# Patient Record
Sex: Female | Born: 1970 | Race: Black or African American | Hispanic: No | Smoking: Never smoker
Health system: Southern US, Community
[De-identification: ages and names within clinical notes are randomized; demographics above are authoritative.]

## PROBLEM LIST (undated history)

## (undated) DIAGNOSIS — J45909 Unspecified asthma, uncomplicated: Secondary | ICD-10-CM

## (undated) DIAGNOSIS — U071 COVID-19: Secondary | ICD-10-CM

## (undated) DIAGNOSIS — I10 Essential (primary) hypertension: Secondary | ICD-10-CM

## (undated) HISTORY — PX: VAGINAL HYSTERECTOMY: SUR661

## (undated) HISTORY — DX: COVID-19: U07.1

## (undated) HISTORY — PX: ABDOMINAL HYSTERECTOMY: SHX81

## (undated) HISTORY — PX: CHOLECYSTECTOMY: SHX55

## (undated) HISTORY — DX: Unspecified asthma, uncomplicated: J45.909

---

## 2003-04-23 ENCOUNTER — Other Ambulatory Visit: Payer: Self-pay

## 2004-07-02 ENCOUNTER — Ambulatory Visit: Payer: Self-pay

## 2004-10-13 ENCOUNTER — Ambulatory Visit: Payer: Self-pay | Admitting: *Deleted

## 2006-06-12 ENCOUNTER — Emergency Department: Payer: Self-pay | Admitting: Emergency Medicine

## 2007-03-12 ENCOUNTER — Emergency Department: Payer: Self-pay | Admitting: Internal Medicine

## 2008-03-16 ENCOUNTER — Emergency Department: Payer: Self-pay | Admitting: Emergency Medicine

## 2008-03-18 ENCOUNTER — Inpatient Hospital Stay: Payer: Self-pay | Admitting: Surgery

## 2011-09-15 ENCOUNTER — Emergency Department: Payer: Self-pay | Admitting: *Deleted

## 2012-04-25 ENCOUNTER — Emergency Department: Payer: Self-pay | Admitting: Emergency Medicine

## 2016-06-12 ENCOUNTER — Ambulatory Visit
Admission: EM | Admit: 2016-06-12 | Discharge: 2016-06-12 | Disposition: A | Discharge status: Home / Self Care | Payer: BLUE CROSS/BLUE SHIELD | Attending: Family | Admitting: Family

## 2016-06-12 DIAGNOSIS — L02411 Cutaneous abscess of right axilla: Secondary | ICD-10-CM | POA: Diagnosis not present

## 2016-06-12 DIAGNOSIS — M25462 Effusion, left knee: Secondary | ICD-10-CM

## 2016-06-12 DIAGNOSIS — M25562 Pain in left knee: Secondary | ICD-10-CM

## 2016-06-12 MED ORDER — SULFAMETHOXAZOLE-TRIMETHOPRIM 800-160 MG PO TABS: 1.0000 | ORAL_TABLET | Freq: Two times a day (BID) | ORAL | 0 refills | Status: AC

## 2016-06-12 MED ORDER — NAPROXEN 500 MG PO TABS: 500.0000 mg | ORAL_TABLET | Freq: Two times a day (BID) | ORAL | 0 refills | Status: DC | PRN

## 2016-06-12 NOTE — ED Provider Notes (Signed)
CSN: [id number]     Arrival date & time 06/12/16  1326 History   First MD Initiated Contact with Patient 06/12/16 1418     Chief Complaint  Patient presents with  . Abscess    right armpit  . Foot Swelling    bilateral   (Consider location/radiation/quality/duration/timing/severity/associated sxs/prior Treatment) 46 year old female presents with a possible cyst or abscess under her right arm along her bra-line. She had an abscess in that area about 4-5 months ago that was drained at [facility]. They extracted pus and fluid out of abscess and placed her on antibiotics. She is now experiencing another ?cyst for the past week in the same area. She has been applying warm compresses to area with no relief. She is also concerned over left knee swelling and pain that has been occurring also for the past week. 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 She has worked 2 weeks straight without a break and uncertain if swelling is due to prolonged standing. She took an Aleve last night which has helped the pain and swelling but causes drowsiness. She had also noticed increased swelling in both feet but they have improved with elevating her feet last night. She has had surgery on her left knee to remove a ?cyst in 2010. Otherwise no other chronic health issues. Takes no daily medication.    The history is provided by the patient.    History reviewed. No pertinent past medical history. Past Surgical History:  Procedure Laterality Date  . ABDOMINAL HYSTERECTOMY    . CESAREAN SECTION    . CHOLECYSTECTOMY     History reviewed. No pertinent family history. Social History  Substance Use Topics  . Smoking status: Never Smoker  . Smokeless tobacco: Never Used  . Alcohol use No   OB History    No data available     Review of Systems  Constitutional: Positive for fatigue. Negative for appetite change, chills and fever.  Respiratory: Negative for cough, chest tightness and shortness of breath.     Cardiovascular: Negative for chest pain and leg swelling.  Gastrointestinal: Negative for diarrhea, nausea and vomiting.  Endocrine: Negative for polydipsia, polyphagia and polyuria.  Genitourinary: Positive for decreased urine volume. Negative for difficulty urinating, dysuria, flank pain, frequency and urgency.  Musculoskeletal: Positive for arthralgias, joint swelling and myalgias. Negative for back pain and neck pain.  Skin: Positive for wound. Negative for color change and rash.  Allergic/Immunologic: Negative for immunocompromised state.  Neurological: Negative for dizziness, tremors, weakness, light-headedness, numbness and headaches.  Hematological: Negative for adenopathy. Does not bruise/bleed easily.    Allergies  Shellfish allergy; Codeine; and Latex  Home Medications   Prior to Admission medications   Medication Sig Start Date End Date Taking? Authorizing Provider  naproxen (NAPROSYN) 500 MG tablet Take 1 tablet (500 mg total) by mouth 2 (two) times daily as needed for moderate pain. 06/12/16   Sudie Grumbling, NP  sulfamethoxazole-trimethoprim (BACTRIM DS,SEPTRA DS) 800-160 MG tablet Take 1 tablet by mouth 2 (two) times daily. 06/12/16 06/19/16  Sudie Grumbling, NP   Meds Ordered and Administered this Visit  Medications - No data to display  BP 131/75 (BP Location: Left Arm)   Pulse 73   Temp 98 F (36.7 C) (Oral)   Resp 18   Ht  (1.702 m)   Wt 252 lb (114.3 kg)   SpO2 99%   BMI 39.47 kg/m  No data found.  Physical Exam  Constitutional: She is oriented to person, place, and time. She appears well-developed and well-nourished. No distress.  HENT:  Head: Normocephalic and atraumatic.  Mouth/Throat: Oropharynx is clear and moist.  Neck: Normal range of motion. Neck supple.  Cardiovascular: Normal rate, regular rhythm and normal heart sounds.   Pulmonary/Chest: Effort normal and breath sounds normal. No respiratory distress. She exhibits tenderness and  swelling.    5mm wide by 3mm tall oval cyst present along right mid-axillary line. Flesh-colored raised cyst which feels soft but tender. Minimal erythema surrounding area. No discharge.   Musculoskeletal: She exhibits edema and tenderness.       Left knee: She exhibits decreased range of motion, swelling and effusion. She exhibits no ecchymosis, no deformity, no erythema, normal alignment, no LCL laxity, normal patellar mobility, no bony tenderness and no MCL laxity. Tenderness found. Medial joint line tenderness noted.  Left knee has slight decreased range of motion, particularly with extension. Slightly tender over the patella. Effusion present more medial than lateral near patella. 3cm scar present mid-knee. No numbness or distinct swelling distal to area. Pulses are normal and equal bilaterally in her feet and no neuro deficits noted.   Lymphadenopathy:    She has no cervical adenopathy.  Neurological: She is alert and oriented to person, place, and time. She has normal strength. No sensory deficit.  Skin: Skin is warm and dry. Capillary refill takes less than 2 seconds. No rash noted.  Psychiatric: She has a normal mood and affect. Her behavior is normal. Judgment and thought content normal.    Urgent Care Course     .Marland KitchenIncision and Drainage Date/Time: 06/12/2016 2:40 PM Performed by: Carita Pian, Annais Crafts BERRY Authorized by: Sudie Grumbling   Consent:    Consent obtained:  Verbal   Consent given by:  Patient   Risks discussed:  Incomplete drainage and pain   Alternatives discussed:  No treatment, delayed treatment and observation Location:    Type:  Cyst   Size:  5mm by 3mm   Location:  Trunk   Trunk location:  Chest Pre-procedure details:    Skin preparation:  Antiseptic wash Anesthesia (see MAR for exact dosages):    Anesthesia method:  None Procedure type:    Complexity:  Simple Procedure details:    Needle aspiration: no     Incision types:  Single straight   Incision depth:   Dermal   Scalpel blade:  11   Wound management:  Probed and deloculated   Drainage:  Bloody and serous   Drainage amount:  Scant   Wound treatment:  Wound left open   Packing materials:  None Post-procedure details:    Patient tolerance of procedure:  Tolerated well, no immediate complications Comments:     Mostly bloody drainage present. No pus or discolored drainage. More solid contents of cyst.     (including critical care time)  Labs Review Labs Reviewed - No data to display  Imaging Review No results found.   Visual Acuity Review  Right Eye Distance:   Left Eye Distance:   Bilateral Distance:    Right Eye Near:   Left Eye Near:    Bilateral Near:         MDM   1. Abscess of axilla, right   2. Pain and swelling of left knee    Discussed that lesion under right mid-axillary line appears to be a type of cyst- similar to an enlarged skin tag- no purulent drainage obtained. Will place  patient on antibiotics in case of infection beneath cyst- start Bactrim twice a day as directed. Referred patient to dermatologist for further evaluation- patient does have insurance and has seen a local dermatologist here before.  Discussed possible drainage of fluid on left knee- reviewed with patient that I was not comfortable performing joint aspiration on area where previous surgery was performed. Since anti-inflammatories were helping pain and swelling, recommend start Naproxen  twice a day as directed.Ace wrap applied to left knee. Keep elevated as much as possible. Note written to be out of work for 3 days.  Recommend patient see her Orthopedic for further evaluation and possible joint aspiration. Recommend follow-up with the Dermatologist and Orthopedic as planned.      Sudie Grumbling, NP 06/13/16 [phone]

## 2016-06-12 NOTE — ED Triage Notes (Signed)
Pt had a abscess drained at [facility] about 4-5 months ago and she is starting to experience the same pain under her right armpit again. She mentions right knee swelling ( had knee surgery in 2010) and bilateral feet swelling the last 4-5 days.

## 2016-06-12 NOTE — Discharge Instructions (Signed)
Recommend start Bactrim twice a day as directed. May clean cyst area with soap and water. Recommend follow-up with a dermatologist for further evaluation of the cyst. Also start Naproxen  twice a day as needed for knee pain and swelling. Use ace wrap or other compression sleeve to help with support. May need to see the Orthopedic for further evaluation and possible drainage of fluid on knee.

## 2016-07-20 ENCOUNTER — Other Ambulatory Visit: Payer: Self-pay | Admitting: Nurse Practitioner

## 2016-07-20 DIAGNOSIS — R928 Other abnormal and inconclusive findings on diagnostic imaging of breast: Secondary | ICD-10-CM

## 2016-09-09 ENCOUNTER — Other Ambulatory Visit: Payer: BLUE CROSS/BLUE SHIELD

## 2016-09-13 ENCOUNTER — Ambulatory Visit
Admission: RE | Admit: 2016-09-13 | Discharge: 2016-09-13 | Disposition: A | Discharge status: Home / Self Care | Payer: BLUE CROSS/BLUE SHIELD | Source: Ambulatory Visit | Attending: Nurse Practitioner | Admitting: Nurse Practitioner

## 2016-09-13 DIAGNOSIS — R928 Other abnormal and inconclusive findings on diagnostic imaging of breast: Secondary | ICD-10-CM | POA: Insufficient documentation

## 2016-11-27 ENCOUNTER — Ambulatory Visit (INDEPENDENT_AMBULATORY_CARE_PROVIDER_SITE_OTHER): Payer: BLUE CROSS/BLUE SHIELD

## 2016-11-27 ENCOUNTER — Ambulatory Visit
Admission: EM | Admit: 2016-11-27 | Discharge: 2016-11-27 | Disposition: A | Discharge status: Home / Self Care | Payer: BLUE CROSS/BLUE SHIELD | Attending: Emergency Medicine | Admitting: Emergency Medicine

## 2016-11-27 ENCOUNTER — Encounter: Payer: Self-pay | Admitting: Gynecology

## 2016-11-27 DIAGNOSIS — M25531 Pain in right wrist: Secondary | ICD-10-CM | POA: Diagnosis not present

## 2016-11-27 DIAGNOSIS — M654 Radial styloid tenosynovitis [de Quervain]: Secondary | ICD-10-CM

## 2016-11-27 MED ORDER — NAPROXEN 500 MG PO TABS: 500.0000 mg | ORAL_TABLET | Freq: Two times a day (BID) | ORAL | 0 refills | Status: DC

## 2016-11-27 NOTE — ED Provider Notes (Signed)
[facility]    CSN: [id number] Arrival date & time: 11/27/16  1540     History   Chief Complaint Chief Complaint  Patient presents with  . Wrist Pain    HPI Melanie Lester is a 46 y.o. female.   HPI  46 year old female who presents with right radial wrist pain that she's had for 2 days. She states that she is actually had the pain for about 2 weeks but is worsened immensely over the last 2 days. She denies any injury to her wrist. 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 She has mild corresponding pain over the ulnar aspect of the distal ulna but not as bad as on the right.       History reviewed. No pertinent past medical history.  There are no active problems to display for this patient.   Past Surgical History:  Procedure Laterality Date  . ABDOMINAL HYSTERECTOMY    . CESAREAN SECTION    . CHOLECYSTECTOMY      OB History    No data available       Home Medications    Prior to Admission medications   Medication Sig Start Date End Date Taking? Authorizing Provider  naproxen (NAPROSYN) 500 MG tablet Take 1 tablet (500 mg total) by mouth 2 (two) times daily with a meal. 11/27/16   Lutricia Feil, PA-C    Family History No family history on file.  Social History Social History  Substance Use Topics  . Smoking status: Never Smoker  . Smokeless tobacco: Never Used  . Alcohol use No     Allergies   Shellfish allergy; Codeine; and Latex   Review of Systems Review of Systems  Constitutional: Positive for activity change. Negative for appetite change, chills, fatigue and fever.  Musculoskeletal: Positive for myalgias.  All other systems reviewed and are negative.    Physical Exam Triage Vital Signs ED Triage Vitals  Enc Vitals Group     BP 11/27/16 1552 133/72     Pulse Rate 11/27/16 1552 61     Resp 11/27/16 1552 16     Temp 11/27/16 1552 98.4 F (36.9 C)     Temp Source 11/27/16 1552 Oral     SpO2 11/27/16 1552 100 %     Weight 11/27/16 1550 225 lb (102.1 kg)     Height 11/27/16 1550  (1.702 m)     Head Circumference --      Peak Flow --      Pain Score 11/27/16 1551 8     Pain Loc --      Pain Edu? --      Excl. in GC? --    No data found.   Updated Vital Signs BP 133/72 (BP Location: Left Arm)   Pulse 61   Temp 98.4 F (36.9 C) (Oral)   Resp 16   Ht  (1.702 m)   Wt 225 lb (102.1 kg)   SpO2 100%   BMI 35.24 kg/m   Visual Acuity Right Eye Distance:   Left Eye Distance:   Bilateral Distance:    Right Eye Near:   Left Eye Near:    Bilateral Near:     Physical Exam  Constitutional: She is oriented to person, place, and time. She appears well-developed and well-nourished. No distress.  HENT:  Head: Normocephalic.  Eyes: Pupils are equal, round, and reactive to light.  Neck: Normal range of motion.  Musculoskeletal:  She exhibits tenderness.  Examination of the right wrist shows mild decreased range of motion to dorsiflexion plantar flexion and radial deviation. Tenderness is along the tendons of the first dorsal compartment is maximal at the distal tip of the radius. Is a positive Finkelstein's test. Lifting against resistance in supination also reproduces her symptoms.  Neurological: She is alert and oriented to person, place, and time.  Skin: Skin is warm and dry. She is not diaphoretic.  Psychiatric: She has a normal mood and affect. Her behavior is normal. Judgment and thought content normal.  Nursing note and vitals reviewed.    UC Treatments / Results  Labs (all labs ordered are listed, but only abnormal results are displayed) Labs Reviewed - No data to display  EKG  EKG Interpretation None       Radiology Dg Wrist Complete Right  Result Date: 11/27/2016 CLINICAL DATA:  RIGHT wrist pain for 1 month EXAM: RIGHT WRIST - COMPLETE 3+ VIEW COMPARISON:  None. FINDINGS: No distal radius or ulnar fracture. Radiocarpal joint is intact.  No carpal fracture. No soft tissue abnormality. IMPRESSION: No fracture or dislocation. Electronically Signed   By: Genevive Bi M.D.   On: 11/27/2016 16:41    Procedures Procedures (including critical care time)  Medications Ordered in UC Medications - No data to display   Initial Impression / Assessment and Plan / UC Course  I have reviewed the triage vital signs and the nursing notes.  Pertinent labs & imaging results that were available during my care of the patient were reviewed by me and considered in my medical decision making (see chart for details).     Plan: 1. Test/x-ray results and diagnosis reviewed with patient 2. rx as per orders; risks, benefits, potential side effects reviewed with patient 3. Recommend supportive treatment with use of the splint full-time for 1 week and for activities on the second week.. Use Naprosyn one tablet twice daily with food. If not improving in 2 weeks follow-up with orthopedic surgery at [facility]. 4. F/u prn if symptoms worsen or don't improve   Final Clinical Impressions(s) / UC Diagnoses   Final diagnoses:  Tenosynovitis, de Quervain    New Prescriptions New Prescriptions   NAPROXEN (NAPROSYN) 500 MG TABLET    Take 1 tablet (500 mg total) by mouth 2 (two) times daily with a meal.     Controlled Substance Prescriptions [place] Controlled Substance Registry consulted? Not Applicable   Lutricia Feil, PA-C 11/27/16 1657

## 2016-11-27 NOTE — ED Triage Notes (Signed)
Paint c/o right wrist pain x 2 days. Patient deny any injury to her wrist. Per patient an hairdresser.

## 2017-04-11 ENCOUNTER — Emergency Department
Admission: EM | Admit: 2017-04-11 | Discharge: 2017-04-11 | Disposition: A | Discharge status: Home / Self Care | Payer: BLUE CROSS/BLUE SHIELD | Attending: Emergency Medicine | Admitting: Emergency Medicine

## 2017-04-11 ENCOUNTER — Emergency Department: Payer: BLUE CROSS/BLUE SHIELD

## 2017-04-11 ENCOUNTER — Other Ambulatory Visit: Payer: Self-pay

## 2017-04-11 ENCOUNTER — Encounter: Payer: Self-pay | Admitting: Emergency Medicine

## 2017-04-11 DIAGNOSIS — Z79899 Other long term (current) drug therapy: Secondary | ICD-10-CM | POA: Insufficient documentation

## 2017-04-11 DIAGNOSIS — L03211 Cellulitis of face: Secondary | ICD-10-CM

## 2017-04-11 DIAGNOSIS — T783XXA Angioneurotic edema, initial encounter: Secondary | ICD-10-CM | POA: Diagnosis not present

## 2017-04-11 DIAGNOSIS — K047 Periapical abscess without sinus: Secondary | ICD-10-CM | POA: Diagnosis not present

## 2017-04-11 DIAGNOSIS — R600 Localized edema: Secondary | ICD-10-CM | POA: Diagnosis present

## 2017-04-11 LAB — CBC WITH DIFFERENTIAL/PLATELET
BASOS PCT: 1 %
Basophils Absolute: 0.1 10*3/uL (ref 0–0.1)
EOS ABS: 0.4 10*3/uL (ref 0–0.7)
Eosinophils Relative: 4 %
HCT: 36.5 % (ref 35.0–47.0)
HEMOGLOBIN: 12.1 g/dL (ref 12.0–16.0)
LYMPHS ABS: 3 10*3/uL (ref 1.0–3.6)
Lymphocytes Relative: 24 %
MCH: 29.1 pg (ref 26.0–34.0)
MCHC: 33.2 g/dL (ref 32.0–36.0)
MCV: 87.6 fL (ref 80.0–100.0)
MONO ABS: 1 10*3/uL — AB (ref 0.2–0.9)
MONOS PCT: 8 %
NEUTROS PCT: 63 %
Neutro Abs: 7.9 10*3/uL — ABNORMAL HIGH (ref 1.4–6.5)
Platelets: 364 10*3/uL (ref 150–440)
RBC: 4.16 MIL/uL (ref 3.80–5.20)
RDW: 13.7 % (ref 11.5–14.5)
WBC: 12.4 10*3/uL — ABNORMAL HIGH (ref 3.6–11.0)

## 2017-04-11 LAB — BASIC METABOLIC PANEL
Anion gap: 8 (ref 5–15)
BUN: 14 mg/dL (ref 6–20)
CALCIUM: 8.8 mg/dL — AB (ref 8.9–10.3)
CHLORIDE: 104 mmol/L (ref 101–111)
CO2: 24 mmol/L (ref 22–32)
CREATININE: 0.87 mg/dL (ref 0.44–1.00)
GFR calc Af Amer: >60 mL/min (ref >60)
GFR calc non Af Amer: >60 mL/min (ref >60)
Glucose, Bld: 109 mg/dL — ABNORMAL HIGH (ref 65–99)
Potassium: 4 mmol/L (ref 3.5–5.1)
SODIUM: 136 mmol/L (ref 135–145)

## 2017-04-11 MED ORDER — SODIUM CHLORIDE 0.9 % IV SOLN
3.0000 g | INTRAVENOUS | Status: AC
  Administered 2017-04-11: 3 g via INTRAVENOUS
  Filled 2017-04-11: qty 3

## 2017-04-11 MED ORDER — PREDNISONE 10 MG PO TABS: ORAL_TABLET | ORAL | 0 refills | Status: DC

## 2017-04-11 MED ORDER — AMOXICILLIN-POT CLAVULANATE 875-125 MG PO TABS: 1.0000 | ORAL_TABLET | Freq: Two times a day (BID) | ORAL | 0 refills | Status: AC

## 2017-04-11 MED ORDER — METHYLPREDNISOLONE SODIUM SUCC 125 MG IJ SOLR
INTRAMUSCULAR | Status: AC
  Filled 2017-04-11: qty 2

## 2017-04-11 MED ORDER — IOPAMIDOL (ISOVUE-300) INJECTION 61%
75.0000 mL | Freq: Once | INTRAVENOUS | Status: AC | PRN
  Administered 2017-04-11: 75 mL via INTRAVENOUS

## 2017-04-11 MED ORDER — EPINEPHRINE 0.3 MG/0.3ML IJ SOAJ: 0.3000 mg | Freq: Once | INTRAMUSCULAR | 0 refills | Status: AC

## 2017-04-11 MED ORDER — DIPHENHYDRAMINE HCL 50 MG/ML IJ SOLN
INTRAMUSCULAR | Status: AC
  Filled 2017-04-11: qty 1

## 2017-04-11 MED ORDER — FAMOTIDINE IN NACL 20-0.9 MG/50ML-% IV SOLN
INTRAVENOUS | Status: AC
  Filled 2017-04-11: qty 50

## 2017-04-11 MED ORDER — DEXAMETHASONE SODIUM PHOSPHATE 10 MG/ML IJ SOLN
10.0000 mg | Freq: Once | INTRAMUSCULAR | Status: AC
  Administered 2017-04-11: 10 mg via INTRAVENOUS
  Filled 2017-04-11: qty 1

## 2017-04-11 MED ORDER — FAMOTIDINE IN NACL 20-0.9 MG/50ML-% IV SOLN
20.0000 mg | Freq: Once | INTRAVENOUS | Status: AC
  Administered 2017-04-11: 20 mg via INTRAVENOUS

## 2017-04-11 MED ORDER — DIPHENHYDRAMINE HCL 50 MG/ML IJ SOLN
50.0000 mg | INTRAMUSCULAR | Status: AC
  Administered 2017-04-11: 50 mg via INTRAVENOUS

## 2017-04-11 MED ORDER — RACEPINEPHRINE HCL 2.25 % IN NEBU
INHALATION_SOLUTION | RESPIRATORY_TRACT | Status: AC
  Filled 2017-04-11: qty 0.5

## 2017-04-11 MED ORDER — METHYLPREDNISOLONE SODIUM SUCC 125 MG IJ SOLR
125.0000 mg | Freq: Once | INTRAMUSCULAR | Status: AC
  Administered 2017-04-11: 125 mg via INTRAVENOUS

## 2017-04-11 MED ORDER — RACEPINEPHRINE HCL 2.25 % IN NEBU
0.5000 mL | INHALATION_SOLUTION | Freq: Once | RESPIRATORY_TRACT | Status: AC
  Administered 2017-04-11: 0.5 mL via RESPIRATORY_TRACT

## 2017-04-11 NOTE — ED Provider Notes (Signed)
[facility] Emergency Department Provider Note  ____________________________________________   First MD Initiated Contact with Patient 04/11/17 0408     (approximate)  I have reviewed the triage vital signs and the nursing notes.   HISTORY  Chief Complaint Allergic Reaction    HPI Melanie Lester is a 47 y.o. female with no contributory past medical history but who does report a number of allergies including an allergy to clindamycin who presents for evaluation of acute onset severe facial swelling.  She reports that he developed some pain in a right upper tooth the day before yesterday.  She followed up with the dentist yesterday and she was started on clindamycin.  She took the first dose in the afternoon yesterday but did not remember that she had an allergy to it.  She was starting to feel little bit swollen before she went to bed and then she woke up just prior to arrival with severe swelling throughout her face.  She reports that when she tries to lie flat she feels some difficulty breathing but only when she is lying flat.  Her voice is a little bit muffled and different from normal.  She is not in any respiratory distress.  When she woke up with the swelling she called her mom and her mom confirmed that she did have an allergic reaction to clindamycin in the past although the patient does not know what that reaction was.  She denies fever/chills, visual changes, difficulty swallowing, difficulty breathing except when lying flat, nausea, vomiting, abdominal pain, and diarrhea.  Nothing in particular makes her symptoms better nor worse and she has not taken anything for the symptoms.  History reviewed. No pertinent past medical history.  There are no active problems to display for this patient.   Past Surgical History:  Procedure Laterality Date  . ABDOMINAL HYSTERECTOMY    . CESAREAN SECTION    . CHOLECYSTECTOMY      Prior to Admission medications     Medication Sig Start Date End Date Taking? Authorizing Provider  amoxicillin-clavulanate (AUGMENTIN) 875-125 MG tablet Take 1 tablet by mouth every 12 (twelve) hours for 10 days. 04/11/17 04/21/17  Loleta Rose, MD  EPINEPHrine (EPIPEN 2-PAK) 0.3 mg/0.3 mL IJ SOAJ injection Inject 0.3 mLs (0.3 mg total) into the muscle once for 1 dose. Take for severe allergic reaction, then come immediately to the Emergency Department or call 911. 04/11/17 04/11/17  Loleta Rose, MD  naproxen (NAPROSYN) 500 MG tablet Take 1 tablet (500 mg total) by mouth 2 (two) times daily with a meal. 11/27/16   Lutricia Feil, PA-C  predniSONE (DELTASONE) 10 MG tablet Take 6 tabs (60 mg) PO x 3 days, then take 4 tabs (40 mg) PO x 3 days, then take 2 tabs (20 mg) PO x 3 days, then take 1 tab (10 mg) PO x 3 days, then take 1/2 tab (5 mg) PO x 4 days. 04/11/17   Loleta Rose, MD    Allergies Shellfish allergy; Clindamycin/lincomycin; Codeine; and Latex  No family history on file.  Social History Social History   Tobacco Use  . Smoking status: Never Smoker  . Smokeless tobacco: Never Used  Substance Use Topics  . Alcohol use: No  . Drug use: No    Review of Systems Constitutional: No fever/chills Eyes: No visual changes. ENT: Acute onset severe swelling to the face.  No sore throat nor difficulty swallowing. Cardiovascular: Denies chest pain. Respiratory: Mild shortness of breath when lying flat.  Gastrointestinal: No abdominal pain.  No nausea, no vomiting.  No diarrhea.  No constipation. Genitourinary: Negative for dysuria. Musculoskeletal: Negative for neck pain.  Negative for back pain. Integumentary: Acute onset severe swelling to the face Neurological: Negative for headaches, focal weakness or numbness.   ____________________________________________   PHYSICAL EXAM:  VITAL SIGNS: ED Triage Vitals [04/11/17 0406]  Enc Vitals Group     BP 138/77     Pulse Rate 74     Resp 20     Temp 99.3 F (37.4 C)      Temp Source Axillary     SpO2 99 %     Weight      Height      Head Circumference      Peak Flow      Pain Score      Pain Loc      Pain Edu?      Excl. in GC?     Constitutional: Alert and oriented.  No acute distress but with obvious angioedema. Eyes: Conjunctivae are normal.  Head: Atraumatic. Nose: No congestion/rhinnorhea. Mouth/Throat: Mucous membranes are moist.  No obvious tongue swelling or swelling of the posterior oropharynx, but visualization is relatively poor with a Mallampati score of III-IV.  No difficulty swallowing secretions.  Slightly hoarse voice Neck: No stridor.  No meningeal signs.   Cardiovascular: Normal rate, regular rhythm. Good peripheral circulation. Grossly normal heart sounds. Respiratory: Normal respiratory effort.  No retractions. Lungs CTAB. Gastrointestinal: Soft and nontender. No distention.  Musculoskeletal: No lower extremity tenderness nor edema. No gross deformities of extremities. Neurologic:  Normal speech and language. No gross focal neurologic deficits are appreciated.  Skin:  Skin is warm, dry and intact. No rash noted. Psychiatric: Mood and affect are normal. Speech and behavior are normal.  ____________________________________________   LABS (all labs ordered are listed, but only abnormal results are displayed)  Labs Reviewed  CBC WITH DIFFERENTIAL/PLATELET - Abnormal; Notable for the following components:      Result Value   WBC 12.4 (*)    Neutro Abs 7.9 (*)    Monocytes Absolute 1.0 (*)    All other components within normal limits  BASIC METABOLIC PANEL - Abnormal; Notable for the following components:   Glucose, Bld 109 (*)    Calcium 8.8 (*)    All other components within normal limits   ____________________________________________  EKG  None - EKG not ordered by ED physician ____________________________________________  RADIOLOGY   ED MD interpretation:  Facial cellulitis, dental abscess, and  angioedema  Official radiology report(s): Ct Soft Tissue Neck W Contrast  Result Date: 04/11/2017 CLINICAL DATA:  Seen at dentist yesterday for dental abscess. Took antibiotic with subsequent allergic reaction. Assess for abscess, cellulitis, angioedema. Evaluate airway. EXAM: CT NECK WITH CONTRAST TECHNIQUE: Multidetector CT imaging of the neck was performed using the standard protocol following the bolus administration of intravenous contrast. CONTRAST:  75mL ISOVUE-300 IOPAMIDOL (ISOVUE-300) INJECTION 61% COMPARISON:  None. FINDINGS: PHARYNX AND LARYNX: Normal.  Widely patent airway. SALIVARY GLANDS: Normal. THYROID: Mild thyromegaly without dominant nodule. LYMPH NODES: No lymphadenopathy by CT size criteria. VASCULAR: Normal. LIMITED INTRACRANIAL: Normal. VISUALIZED ORBITS: Normal. MASTOIDS AND VISUALIZED PARANASAL SINUSES: Subcentimeter RIGHT posterior ethmoid osteoma. Mild RIGHT maxillary sinus mucosal thickening without paranasal sinus air-fluid levels. Included mastoid air cells are well aerated. SKELETON: Large tooth 10 periapical abscess. Smaller teeth 6 and 7 periapical abscess. Multiple dental caries. UPPER CHEST: Lung apices are clear. No superior mediastinal lymphadenopathy. OTHER: 6 x 7  x 21 mm rim enhancing fluid collection contiguous with RIGHT maxillary alveolar ridge at tooth 6 tracking to the nasal labial fold. Disproportionate circumferential perioral edema and swelling extending to the RIGHT periorbital soft tissues. IMPRESSION: 1. Poor dentition with 6 x 7 x 21 mm RIGHT maxillary subperiosteal abscess contiguous with tooth 6 periapical abscess. Poor dentition with multiple additional periapical abscess. 2. Disproportionate perioral soft tissue swelling concerning for angioedema though there is a component of RIGHT facial cellulitis extending to periorbital soft tissues. No orbital cellulitis. 3. Patent airway. Electronically Signed   By: Awilda Metro M.D.   On: 04/11/2017 05:38     ____________________________________________   PROCEDURES  Critical Care performed: Yes, see critical care procedure note(s)   Procedure(s) performed:   .Critical Care Performed by: Loleta Rose, MD Authorized by: Loleta Rose, MD   Critical care provider statement:    Critical care time (minutes):  30   Critical care time was exclusive of:  Separately billable procedures and treating other patients   Critical care was necessary to treat or prevent imminent or life-threatening deterioration of the following conditions: anaphylaxis.   Critical care was time spent personally by me on the following activities:  Development of treatment plan with patient or surrogate, discussions with consultants, evaluation of patient's response to treatment, examination of patient, obtaining history from patient or surrogate, ordering and performing treatments and interventions, ordering and review of laboratory studies, ordering and review of radiographic studies, pulse oximetry, re-evaluation of patient's condition and review of old charts     ____________________________________________   INITIAL IMPRESSION / ASSESSMENT AND PLAN / ED COURSE  As part of my medical decision making, I reviewed the following data within the electronic MEDICAL RECORD NUMBER Nursing notes reviewed and incorporated, Labs reviewed  and Patient signed out to     Differential diagnosis includes, but is not limited to, angioedema secondary to allergic reaction to medication, hereditary angioedema, rapidly worsening dental abscess leading to facial cellulitis or facial abscess.  The patient is not on any other medications, particularly she denies ACE inhibitors.  She is breathing comfortably at this time but I am concerned about the amount of facial swelling and her slightly muffled voice.  I will treat her aggressively with Solu-Medrol, Benadryl, and famotidine by IV as well as a racemic epinephrine I will have a low  threshold for giving intramuscular epinephrine as well.  I am also concerned about the possibility this may represent a rapidly spreading infectious process.  Lab results are pending but once I am more comfortable that her angioedema is relatively stable and at least not worsening rapidly, we will send her for a CT scan with IV contrast maxillofacial and neck to rule out rapidly spreading infection as well as to evaluate the patency of her airway.  I updated the patient and her husband and they understand and agree.  Vital signs are stable, no hypotension or GI symptoms.  Clinical Course as of Apr 11 741  Tue Apr 11, 2017  0445 Reassessed the patient, she is stable and feels no worse, possibly a little bit better.  She is now sleepy after the Benadryl.  She is lying mostly flat and is stable for CT scan at this time.  I verified with her and her husband that she has no prior history of allergic reaction to IV contrast.  I will check CT maxillofacial and soft tissue neck with IV contrast to evaluate for facial cellulitis/abscess as well as her airway patency  [  CF]  0451 WBC: (!) 12.4 [CF]  [id number] I updated the patient and her husband about the results of the CT scan.  Basically, I cannot rule out anaphylaxis from the clindamycin, but I think it is more likely that she is having angioedema from facial cellulitis secondary to a dental abscess.  I am not worried about her airway and it was clearly patent on the CT scan.  He is more comfortable now that she was before although the facial swelling is still prominent.I discussed with her and her husband several different options.  [facility] and [facility] her for evaluation by dental or OMFS.  I also suggested that if the 2 of them are comfortable, I will put her on antibiotics and steroids and she can follow-up as an outpatient but I gave strict return precautions if her symptoms are to worsen.  I encouraged them that if they leave from this emergency  department, they may want to consider going directly by [facility].  They prefer to be discharged after making sure she is stable here in this emergency department and her husband said he will decide with her about whether or not [facility] or [facility].  I again gave strict return precautions.  I gave her Unasyn 3 g IV and a dose of Decadron 10 mg IV since it will stay in her system longer than Solu-Medrol.  I prescribed Augmentin and a prednisone taper.  [CF]  [id number] Transferred ED care to Dr. Mayford KnifeWilliams to reassess and see if alterations of the plan are required.  [CF]    Clinical Course User Index [CF] Loleta RoseForbach, Jakhari Space, MD    ____________________________________________  FINAL CLINICAL IMPRESSION(S) / ED DIAGNOSES  Final diagnoses:  Facial cellulitis  Dental abscess     MEDICATIONS GIVEN DURING THIS VISIT:  Medications  dexamethasone (DECADRON) injection 10 mg (not administered)  diphenhydrAMINE (BENADRYL) injection 50 mg (50 mg Intravenous Given 04/11/17 0413)  methylPREDNISolone sodium succinate (SOLU-MEDROL) 125 mg/2 mL injection 125 mg (125 mg Intravenous Given 04/11/17 0411)  famotidine (PEPCID) IVPB 20 mg premix (0 mg Intravenous Stopped 04/11/17 0453)  Racepinephrine HCl 2.25 % nebulizer solution 0.5 mL (0.5 mLs Nebulization Given 04/11/17 0413)  iopamidol (ISOVUE-300) 61 % injection 75 mL (75 mLs Intravenous Contrast Given 04/11/17 0509)  Ampicillin-Sulbactam (UNASYN) 3 g in sodium chloride 0.9 % 100 mL IVPB (0 g Intravenous Stopped 04/11/17 0729)     ED Discharge Orders        Ordered    amoxicillin-clavulanate (AUGMENTIN) 875-125 MG tablet  Every 12 hours     04/11/17 0722    predniSONE (DELTASONE) 10 MG tablet     04/11/17 0725    EPINEPHrine (EPIPEN 2-PAK) 0.3 mg/0.3 mL IJ SOAJ injection   Once     04/11/17 0725       Note:  This document was prepared using Dragon voice recognition software and may include unintentional dictation  errors.    Loleta RoseForbach, Hiawatha Dressel, MD 04/11/17 [phone]

## 2017-04-11 NOTE — ED Triage Notes (Signed)
Pt to room 6, ambulatory without difficulty or distress noted; reports seen at dentist yesterday for dental abscess; rx clindamycin and has taken 3 doses; awoke PTA with facial swelling and some SHOB; st she spoke with her mother who reminded her that she was allergic to such

## 2017-04-11 NOTE — ED Provider Notes (Signed)
Patient is in no distress, currently feeling better.  She still has some swelling, we will continue with discharge instructions by Dr. Joaquin CourtsForbach   Melanie Lester, Melanie AmsterdamJonathan E, MD 04/11/17 [phone]

## 2017-04-11 NOTE — ED Notes (Signed)
Patient ambulatory to lobby. Steady gait, NAD noted. Patient verbalized understanding to [facility]. Verbalized understanding of prescriptions.

## 2017-04-11 NOTE — ED Notes (Signed)
Pt to the ED for swelling to the face and lips mostly on the right side. Pt was seen by dentist and dx with abcess and started on antibiotic. Possible allergic reaction to antibitic. Pt says she feels as if her throat is swelling. Breathing is wnl. Pt is able to maintain secretions.

## 2017-04-11 NOTE — Discharge Instructions (Signed)
As we discussed, although it is possible your swelling is due to an allergic reaction to the clindamycin, I think it is more likely that it is the result of infection spreading in your face from the dental abscess.  However it could also be a combination of the two.  We [facility] where [facility] dentist and oral maxillofacial surgeons immediately available, but you prefer charged at this time which we think is reasonable given that you have been stable in the emergency department for a number of hours.  However, if he gets any worsening of the swelling, any difficulty breathing, or other symptoms that concern you, call 911 or return to the nearest emergency department.  If you feel you need to be seen but feels stable enough to go by private vehicle, you may want to [facility] the specialist that have available that are not available at [facility].  Please take your medications as prescribed for the full course of treatment.  If [facility], please consider following up with your dentist as soon as possible.

## 2017-08-26 ENCOUNTER — Emergency Department
Admission: EM | Admit: 2017-08-26 | Discharge: 2017-08-26 | Disposition: A | Discharge status: Home / Self Care | Payer: BLUE CROSS/BLUE SHIELD | Attending: Emergency Medicine | Admitting: Emergency Medicine

## 2017-08-26 ENCOUNTER — Encounter: Payer: Self-pay | Admitting: Emergency Medicine

## 2017-08-26 ENCOUNTER — Other Ambulatory Visit: Payer: Self-pay

## 2017-08-26 ENCOUNTER — Emergency Department: Payer: BLUE CROSS/BLUE SHIELD

## 2017-08-26 DIAGNOSIS — I10 Essential (primary) hypertension: Secondary | ICD-10-CM | POA: Diagnosis not present

## 2017-08-26 DIAGNOSIS — M25511 Pain in right shoulder: Secondary | ICD-10-CM | POA: Diagnosis present

## 2017-08-26 DIAGNOSIS — Z91040 Latex allergy status: Secondary | ICD-10-CM | POA: Diagnosis not present

## 2017-08-26 HISTORY — DX: Essential (primary) hypertension: I10

## 2017-08-26 MED ORDER — KETOROLAC TROMETHAMINE 10 MG PO TABS: 10.0000 mg | ORAL_TABLET | Freq: Three times a day (TID) | ORAL | 0 refills | Status: AC

## 2017-08-26 MED ORDER — KETOROLAC TROMETHAMINE 30 MG/ML IJ SOLN
30.0000 mg | Freq: Once | INTRAMUSCULAR | Status: AC
  Administered 2017-08-26: 30 mg via INTRAMUSCULAR
  Filled 2017-08-26: qty 1

## 2017-08-26 MED ORDER — DICLOFENAC POTASSIUM 50 MG PO TABS: 50.0000 mg | ORAL_TABLET | Freq: Two times a day (BID) | ORAL | 0 refills | Status: AC

## 2017-08-26 MED ORDER — CYCLOBENZAPRINE HCL 5 MG PO TABS: 5.0000 mg | ORAL_TABLET | Freq: Every day | ORAL | 0 refills | Status: AC

## 2017-08-26 NOTE — ED Notes (Signed)
Pt reports severe right shoulder pain that began approx 1 month ago. Can raise shoulder to the front level with shoulder but cannot exceed height of shoulder. Cannot move it right now d/t pain. Pt states shoulder locked up on her today at work. 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   States she went to a doctor about issue early June. Has been waiting to hear when MRI is scheduled for. Still has not heard.

## 2017-08-26 NOTE — Discharge Instructions (Signed)
Your exam and x-ray are consistent with a likely shoulder strain. Take the prescription meds as directed. Follow-up with Ortho for further management.

## 2017-08-26 NOTE — ED Triage Notes (Signed)
Pt to ED via POV c/o right shoulder pain x 1 month. Pt states that she has seen her PCP. Pt was at work today and her shoulder "locked up". Pt states that she cannot take the pain anymore. Pt is in NAD at this time.

## 2017-08-27 NOTE — ED Provider Notes (Signed)
[facility] Emergency Department Provider Note ____________________________________________  Time seen: 1830  I have reviewed the triage vital signs and the nursing notes.  HISTORY  Chief Complaint  Shoulder Pain  HPI Melanie Lester is a 47 y.o. left-handed female presents herself to the ED for evaluation 1 month complaint of right shoulder pain.  Patient describes she was seen by primary provider at [facility], and was given an injection of Toradol.  She reported improvement of her symptoms about 3 or 4 days.  She was told that she would be referred for an MRI, but is still awaiting the authorization and scheduling.  She works as a Interior and spatial designer and reports pain to the right shoulder with attempts to raise arm above shoulder level.  She denies any known injury, accident, or trauma.  She denies any history of ongoing or chronic shoulder pain.  Patient denies any distal paresthesias, chest pain, or shortness of breath.  She presents today for further evaluation of her right lateral and posterior shoulder pain.  Past Medical History:  Diagnosis Date  . Hypertension     There are no active problems to display for this patient.   Past Surgical History:  Procedure Laterality Date  . ABDOMINAL HYSTERECTOMY    . CESAREAN SECTION    . CHOLECYSTECTOMY      Prior to Admission medications   Medication Sig Start Date End Date Taking? Authorizing Provider  cyclobenzaprine (FLEXERIL) 5 MG tablet Take 1 tablet (5 mg total) by mouth at bedtime. 08/26/17   Amyla Heffner, Charlesetta Ivory, PA-C  diclofenac (CATAFLAM) 50 MG tablet Take 1 tablet (50 mg total) by mouth 2 (two) times daily for 15 days. 08/26/17 09/10/17  Jefrey Raburn, Charlesetta Ivory, PA-C  ketorolac (TORADOL) 10 MG tablet Take 1 tablet (10 mg total) by mouth every 8 (eight) hours. 08/26/17   Keishawn Rajewski, Charlesetta Ivory, PA-C  naproxen (NAPROSYN) 500 MG tablet Take 1 tablet (500 mg total) by mouth 2 (two) times daily with a  meal. Patient not taking: Reported on 04/11/2017 11/27/16   Lutricia Feil, PA-C  predniSONE (DELTASONE) 10 MG tablet Take 6 tabs (60 mg) PO x 3 days, then take 4 tabs (40 mg) PO x 3 days, then take 2 tabs (20 mg) PO x 3 days, then take 1 tab (10 mg) PO x 3 days, then take 1/2 tab (5 mg) PO x 4 days. 04/11/17   Loleta Rose, MD    Allergies Shellfish allergy; Clindamycin/lincomycin; Codeine; and Latex  No family history on file.  Social History Social History   Tobacco Use  . Smoking status: Never Smoker  . Smokeless tobacco: Never Used  Substance Use Topics  . Alcohol use: No  . Drug use: No    Review of Systems  Constitutional: Negative for fever. Cardiovascular: Negative for chest pain. Respiratory: Negative for shortness of breath. Musculoskeletal: Negative for back pain.  Shoulder pain as above. Skin: Negative for rash. Neurological: Negative for headaches, focal weakness or numbness. ____________________________________________  PHYSICAL EXAM:  VITAL SIGNS: ED Triage Vitals  Enc Vitals Group     BP 08/26/17 1727 128/62     Pulse Rate 08/26/17 1727 78     Resp 08/26/17 1727 16     Temp 08/26/17 1727 98.3 F (36.8 C)     Temp Source 08/26/17 1727 Oral     SpO2 08/26/17 1727 98 %     Weight 08/26/17 1728 237 lb (107.5 kg)     Height 08/26/17  1728 5\' 7"  (1.702 m)     Head Circumference --      Peak Flow --      Pain Score 08/26/17 1727 9     Pain Loc --      Pain Edu? --      Excl. in GC? --     Constitutional: Alert and oriented. Well appearing and in no distress. Head: Normocephalic and atraumatic. Neck: Supple. No thyromegaly. Cardiovascular: Normal rate, regular rhythm. Normal distal pulses. Respiratory: Normal respiratory effort. No wheezes/rales/rhonchi. Musculoskeletal: Right shoulder without obvious deformity or sulcus sign, or dislocation.  Patient with decreased active range of motion to about 80 degrees of extension and abduction.  She is tender  to palpation along the posterior aspect of the shoulder at the infraspinatus and supraspinatus insertion.  Rotator cuff testing is intact to both infraspinatus and supraspinatus resistance testing.  Normal composite fist distally.  Nontender with normal range of motion in all extremities.  Neurologic: Nerves II through XII grossly intact.  Normal UE DTRs bilaterally.  Normal gait without ataxia. Normal speech and language. No gross focal neurologic deficits are appreciated. Skin:  Skin is warm, dry and intact. No rash noted. ____________________________________________   RADIOLOGY  Right Shoulder  FINDINGS: There is no evidence of fracture or dislocation. There is no evidence of arthropathy or other focal bone abnormality. Vacuum phenomenon within the glenohumeral joint. Soft tissues are unremarkable. ____________________________________________  PROCEDURES  Procedures Toradol 30 mg IM ____________________________________________  INITIAL IMPRESSION / ASSESSMENT AND PLAN / ED COURSE  Patient with ED evaluation of acute intermittent right shoulder pain and disability.  Patient's exam likely consistent with rotator cuff tendinitis.  Patient will be discharged with prescriptions for Toradol to dose in the short-term.  She will then transition to diclofenac daily anti-inflammatory relief.  Prescription for Flexeril is also provided for muscle spasm pain relief.  Patient is referred to orthopedics for definitive management of her symptoms.  She will await MRI from her PCP as planned.  A work note is provided for today as requested. ____________________________________________  FINAL CLINICAL IMPRESSION(S) / ED DIAGNOSES  Final diagnoses:  Acute pain of right shoulder      Karmen StabsMenshew, Charlesetta IvoryJenise V Bacon, PA-C 08/27/17 2337    Minna AntisPaduchowski, Kevin, MD 08/29/17 1352

## 2017-09-25 ENCOUNTER — Ambulatory Visit: Payer: BLUE CROSS/BLUE SHIELD

## 2017-11-13 ENCOUNTER — Other Ambulatory Visit: Payer: Self-pay | Admitting: Orthopedic Surgery

## 2017-11-13 DIAGNOSIS — M25511 Pain in right shoulder: Secondary | ICD-10-CM

## 2017-11-27 ENCOUNTER — Ambulatory Visit
Admission: RE | Admit: 2017-11-27 | Discharge: 2017-11-27 | Disposition: A | Discharge status: Home / Self Care | Payer: BLUE CROSS/BLUE SHIELD | Source: Ambulatory Visit | Attending: Orthopedic Surgery | Admitting: Orthopedic Surgery

## 2017-11-27 DIAGNOSIS — M25511 Pain in right shoulder: Secondary | ICD-10-CM

## 2017-11-27 DIAGNOSIS — M12811 Other specific arthropathies, not elsewhere classified, right shoulder: Secondary | ICD-10-CM | POA: Insufficient documentation

## 2017-11-27 MED ORDER — SODIUM CHLORIDE 0.9 % IJ SOLN
6.0000 mL | INTRAMUSCULAR | Status: DC | PRN
  Administered 2017-11-27: 6 mL

## 2017-11-27 MED ORDER — GADOBENATE DIMEGLUMINE 529 MG/ML IV SOLN
0.1000 mL | Freq: Once | INTRAVENOUS | Status: AC | PRN
  Administered 2017-11-27: 0.1 mL via INTRA_ARTICULAR

## 2017-11-27 MED ORDER — IOPAMIDOL (ISOVUE-200) INJECTION 41%
6.0000 mL | Freq: Once | INTRAVENOUS | Status: AC | PRN
  Administered 2017-11-27: 6 mL
  Filled 2017-11-27: qty 50

## 2017-11-27 MED ORDER — LIDOCAINE HCL (PF) 1 % IJ SOLN
5.0000 mL | Freq: Once | INTRAMUSCULAR | Status: AC
  Administered 2017-11-27: 5 mL
  Filled 2017-11-27: qty 5

## 2017-12-08 ENCOUNTER — Other Ambulatory Visit: Payer: Self-pay | Admitting: Nurse Practitioner

## 2017-12-08 DIAGNOSIS — Z1231 Encounter for screening mammogram for malignant neoplasm of breast: Secondary | ICD-10-CM

## 2019-03-07 ENCOUNTER — Ambulatory Visit: Payer: BLUE CROSS/BLUE SHIELD | Admitting: Internal Medicine

## 2019-03-14 DIAGNOSIS — I10 Essential (primary) hypertension: Secondary | ICD-10-CM | POA: Insufficient documentation

## 2019-05-06 DIAGNOSIS — Q245 Malformation of coronary vessels: Secondary | ICD-10-CM | POA: Insufficient documentation

## 2019-07-08 ENCOUNTER — Encounter: Payer: Self-pay | Admitting: *Deleted

## 2019-07-08 ENCOUNTER — Emergency Department: Payer: 59

## 2019-07-08 ENCOUNTER — Emergency Department
Admission: EM | Admit: 2019-07-08 | Discharge: 2019-07-09 | Disposition: A | Discharge status: Home / Self Care | Payer: 59 | Attending: Emergency Medicine | Admitting: Emergency Medicine

## 2019-07-08 ENCOUNTER — Other Ambulatory Visit: Payer: Self-pay

## 2019-07-08 DIAGNOSIS — U071 COVID-19: Secondary | ICD-10-CM | POA: Diagnosis not present

## 2019-07-08 DIAGNOSIS — Z79899 Other long term (current) drug therapy: Secondary | ICD-10-CM | POA: Insufficient documentation

## 2019-07-08 DIAGNOSIS — I10 Essential (primary) hypertension: Secondary | ICD-10-CM | POA: Insufficient documentation

## 2019-07-08 DIAGNOSIS — R42 Dizziness and giddiness: Secondary | ICD-10-CM | POA: Diagnosis present

## 2019-07-08 DIAGNOSIS — Z91040 Latex allergy status: Secondary | ICD-10-CM | POA: Insufficient documentation

## 2019-07-08 LAB — BASIC METABOLIC PANEL
Anion gap: 10 (ref 5–15)
BUN: 13 mg/dL (ref 6–20)
CO2: 22 mmol/L (ref 22–32)
Calcium: 9.2 mg/dL (ref 8.9–10.3)
Chloride: 103 mmol/L (ref 98–111)
Creatinine, Ser: 1.01 mg/dL — ABNORMAL HIGH (ref 0.44–1.00)
GFR calc Af Amer: >60 mL/min (ref >60)
GFR calc non Af Amer: >60 mL/min (ref >60)
Glucose, Bld: 116 mg/dL — ABNORMAL HIGH (ref 70–99)
Potassium: 4 mmol/L (ref 3.5–5.1)
Sodium: 135 mmol/L (ref 135–145)

## 2019-07-08 LAB — CBC
HCT: 38.4 % (ref 36.0–46.0)
Hemoglobin: 12.5 g/dL (ref 12.0–15.0)
MCH: 28.9 pg (ref 26.0–34.0)
MCHC: 32.6 g/dL (ref 30.0–36.0)
MCV: 88.9 fL (ref 80.0–100.0)
Platelets: 338 10*3/uL (ref 150–400)
RBC: 4.32 MIL/uL (ref 3.87–5.11)
RDW: 13.3 % (ref 11.5–15.5)
WBC: 7.2 10*3/uL (ref 4.0–10.5)
nRBC: 0 % (ref 0.0–0.2)

## 2019-07-08 LAB — TROPONIN I (HIGH SENSITIVITY): Troponin I (High Sensitivity): 3 ng/L (ref <18)

## 2019-07-08 MED ORDER — IBUPROFEN 600 MG PO TABS
600.0000 mg | ORAL_TABLET | Freq: Once | ORAL | Status: DC
  Filled 2019-07-08: qty 1

## 2019-07-08 MED ORDER — AZITHROMYCIN 500 MG PO TABS
500.0000 mg | ORAL_TABLET | Freq: Once | ORAL | Status: DC
  Filled 2019-07-08: qty 1

## 2019-07-08 MED ORDER — PREDNISONE 20 MG PO TABS
60.0000 mg | ORAL_TABLET | Freq: Once | ORAL | Status: DC
  Filled 2019-07-08: qty 3

## 2019-07-08 MED ORDER — ACETAMINOPHEN 500 MG PO TABS
1000.0000 mg | ORAL_TABLET | Freq: Once | ORAL | Status: AC
  Administered 2019-07-08: 20:00:00 1000 mg via ORAL
  Filled 2019-07-08: qty 2

## 2019-07-08 MED ORDER — SODIUM CHLORIDE 0.9% FLUSH
3.0000 mL | Freq: Once | INTRAVENOUS | Status: AC
  Administered 2019-07-09: 02:00:00 3 mL via INTRAVENOUS

## 2019-07-08 MED ORDER — IPRATROPIUM-ALBUTEROL 0.5-2.5 (3) MG/3ML IN SOLN
3.0000 mL | Freq: Once | RESPIRATORY_TRACT | Status: AC
  Administered 2019-07-09: 01:00:00 3 mL via RESPIRATORY_TRACT
  Filled 2019-07-08: qty 3

## 2019-07-08 NOTE — ED Provider Notes (Addendum)
[facility] Emergency Department Provider Note  ____________________________________________   First MD Initiated Contact with Patient 07/08/19 2312     (approximate)  I have reviewed the triage vital signs and the nursing notes.   HISTORY  Chief Complaint Dizziness    HPI Melanie Lester is a 49 y.o. female  With h/o HTN here with cough, SOB, fever, chest pain. Pt reports that starting yesterday, she developed acute onset of body aches, chills, and cough. She has had persistent cough and a sensation of chest pressure/tightness since then, along with fevers. She has had some nausea but no vomiting or diarrhea. She has not taken anything for this. No known sick contacts but she does work as a Interior and spatial designer. Denies any abd pain. No nausea, vomiting, or diarrhea. No dysuria or vaginal bleeding or discharge. No other complaints. She has not been vaccinated.       Past Medical History:  Diagnosis Date  . Hypertension     There are no problems to display for this patient.   Past Surgical History:  Procedure Laterality Date  . ABDOMINAL HYSTERECTOMY    . CESAREAN SECTION    . CHOLECYSTECTOMY      Prior to Admission medications   Medication Sig Start Date End Date Taking? Authorizing Provider  albuterol (VENTOLIN HFA) 108 (90 Base) MCG/ACT inhaler Inhale 2 puffs into the lungs every 6 (six) hours as needed for wheezing or shortness of breath. 07/09/19   Shaune Pollack, MD  azithromycin (ZITHROMAX Z-PAK) 250 MG tablet Take 2 tablets (500 mg) on  Day 1,  followed by 1 tablet (250 mg) once daily on Days 2 through 5. 07/09/19   Shaune Pollack, MD  cyclobenzaprine (FLEXERIL) 5 MG tablet Take 1 tablet (5 mg total) by mouth at bedtime. 08/26/17   Menshew, Charlesetta Ivory, PA-C  ketorolac (TORADOL) 10 MG tablet Take 1 tablet (10 mg total) by mouth every 8 (eight) hours. 08/26/17   Menshew, Charlesetta Ivory, PA-C  naproxen (NAPROSYN) 500 MG tablet Take 1 tablet (500 mg  total) by mouth 2 (two) times daily with a meal. Patient not taking: Reported on 04/11/2017 11/27/16   Lutricia Feil, PA-C  predniSONE (DELTASONE) 20 MG tablet Take 2 tablets (40 mg total) by mouth daily for 5 days. 07/09/19 07/14/19  Shaune Pollack, MD  promethazine (PHENERGAN) 12.5 MG tablet Take 2 tablets (25 mg total) by mouth every 6 (six) hours as needed for nausea or vomiting. 07/09/19   Shaune Pollack, MD    Allergies Shellfish allergy, Clindamycin/lincomycin, Codeine, and Latex  No family history on file.  Social History Social History   Tobacco Use  . Smoking status: Never Smoker  . Smokeless tobacco: Never Used  Substance Use Topics  . Alcohol use: No  . Drug use: No    Review of Systems  Review of Systems  Constitutional: Positive for chills, fatigue and fever.  HENT: Negative for congestion and sore throat.   Eyes: Negative for visual disturbance.  Respiratory: Positive for cough and shortness of breath.   Cardiovascular: Negative for chest pain.  Gastrointestinal: Positive for nausea. Negative for abdominal pain, diarrhea and vomiting.  Genitourinary: Negative for flank pain.  Musculoskeletal: Negative for back pain and neck pain.  Skin: Negative for rash and wound.  Neurological: Positive for weakness.  All other systems reviewed and are negative.    ____________________________________________  PHYSICAL EXAM:      VITAL SIGNS: ED Triage Vitals  Enc Vitals Group  BP 07/08/19 2009 126/71     Pulse Rate 07/08/19 2009 93     Resp 07/08/19 2009 20     Temp 07/08/19 2009 (!) 102.8 F (39.3 C)     Temp Source 07/08/19 2009 Oral     SpO2 07/08/19 2009 92 %     Weight 07/08/19 2011 249 lb (112.9 kg)     Height 07/08/19 2011 5\' 8"  (1.727 m)     Head Circumference --      Peak Flow --      Pain Score 07/08/19 2011 7     Pain Loc --      Pain Edu? --      Excl. in GC? --      Physical Exam Vitals and nursing note reviewed.  Constitutional:       General: She is not in acute distress.    Appearance: She is well-developed.  HENT:     Head: Normocephalic and atraumatic.     Comments: Mild posterior pharyngeal erythema without tonsillar swelling or exudates Eyes:     Conjunctiva/sclera: Conjunctivae normal.  Cardiovascular:     Rate and Rhythm: Regular rhythm. Tachycardia present.     Heart sounds: Normal heart sounds. No murmur. No friction rub.  Pulmonary:     Effort: Pulmonary effort is normal. No respiratory distress.     Breath sounds: Wheezing present. No rales.     Comments: Scattered mild wheezes, normal WOB, speaking in full sentences Abdominal:     General: There is no distension.     Palpations: Abdomen is soft.     Tenderness: There is no abdominal tenderness.  Musculoskeletal:     Cervical back: Neck supple.  Skin:    General: Skin is warm.     Capillary Refill: Capillary refill takes less than 2 seconds.  Neurological:     Mental Status: She is alert and oriented to person, place, and time.     Motor: No abnormal muscle tone.       ____________________________________________   LABS (all labs ordered are listed, but only abnormal results are displayed)  Labs Reviewed  RESPIRATORY PANEL BY RT PCR (FLU A&B, COVID) - Abnormal; Notable for the following components:      Result Value   SARS Coronavirus 2 by RT PCR POSITIVE (*)    All other components within normal limits  BASIC METABOLIC PANEL - Abnormal; Notable for the following components:   Glucose, Bld 116 (*)    Creatinine, Ser 1.01 (*)    All other components within normal limits  URINALYSIS, COMPLETE (UACMP) WITH MICROSCOPIC - Abnormal; Notable for the following components:   Color, Urine YELLOW (*)    APPearance CLOUDY (*)    Specific Gravity, Urine 1.033 (*)    Hgb urine dipstick SMALL (*)    Protein, ur 30 (*)    All other components within normal limits  HEPATIC FUNCTION PANEL - Abnormal; Notable for the following components:   Alkaline  Phosphatase 34 (*)    All other components within normal limits  CBC  LIPASE, BLOOD  POC URINE PREG, ED  TROPONIN I (HIGH SENSITIVITY)  TROPONIN I (HIGH SENSITIVITY)    ____________________________________________  EKG: Sinus tachycardia, VR 101. PR 140, QRS 68, QTc 412. No acute ST elevations or depressions. No ischemia or infarct. ________________________________________  RADIOLOGY All imaging, including plain films, CT scans, and ultrasounds, independently reviewed by me, and interpretations confirmed via formal radiology reads.  ED MD interpretation:   CXR: Clear,  no acute abnormality   Official radiology report(s): DG Chest 2 View  Result Date: 07/08/2019 CLINICAL DATA:  Fever, chest pain and shortness of breath EXAM: CHEST - 2 VIEW COMPARISON:  None. FINDINGS: The heart size and mediastinal contours are within normal limits. Both lungs are clear. The visualized skeletal structures are unremarkable. IMPRESSION: No active cardiopulmonary disease. Electronically Signed   By: Deatra Robinson M.D.   On: 07/08/2019 20:33    ____________________________________________  PROCEDURES   Procedure(s) performed (including Critical Care):  .1-3 Lead EKG Interpretation Performed by: Shaune Pollack, MD Authorized by: Shaune Pollack, MD     Interpretation: normal     ECG rate assessment: normal     Rhythm: sinus rhythm     Ectopy: none     Conduction: normal   Comments:     Indication: Chest pain, fever, COVID-19    ____________________________________________  INITIAL IMPRESSION / MDM / ASSESSMENT AND PLAN / ED COURSE  As part of my medical decision making, I reviewed the following data within the electronic MEDICAL RECORD NUMBER Nursing notes reviewed and incorporated, Old chart reviewed, Notes from prior ED visits, and [place] Controlled Substance Database       *KRYSTI HICKLING was evaluated in Emergency Department on 07/09/2019 for the symptoms described in the history of  present illness. She was evaluated in the context of the global COVID-19 pandemic, which necessitated consideration that the patient might be at risk for infection with the SARS-CoV-2 virus that causes COVID-19. Institutional protocols and algorithms that pertain to the evaluation of patients at risk for COVID-19 are in a state of rapid change based on information released by regulatory bodies including the CDC and federal and state organizations. These policies and algorithms were followed during the patient's care in the ED.  Some ED evaluations and interventions may be delayed as a result of limited staffing during the pandemic.*     Medical Decision Making:  49 yo F here with fever, cough, chills. On arrival, pt febrile but satting >90% on RA in no resp distress. Mild wheezing noted. Labs, imaging as above. CXR clear without PNA. CBC without leukocytosis or thrombocytosis. BMP normal. LFTs, lipase reassuring. No signs of severe sepsis. COVID testing is positive.   Following fluids, decadron, and breathing tx, pt feels much better and is tolerating PO. No further nausea. She is ambulatory without hypoxia. Will start on azithromycin, prednisone, and refer for outpt regeneron if interested. Return precautions given. Albuterol also provided. ____________________________________________  FINAL CLINICAL IMPRESSION(S) / ED DIAGNOSES  Final diagnoses:  COVID-19     MEDICATIONS GIVEN DURING THIS VISIT:  Medications  sodium chloride flush (NS) 0.9 % injection 3 mL (3 mLs Intravenous Given 07/09/19 0138)  acetaminophen (TYLENOL) tablet 1,000 mg (1,000 mg Oral Given 07/08/19 2020)  ipratropium-albuterol (DUONEB) 0.5-2.5 (3) MG/3ML nebulizer solution 3 mL (3 mLs Nebulization Given 07/09/19 0123)  ketorolac (TORADOL) 30 MG/ML injection 15 mg (15 mg Intravenous Given 07/09/19 0105)  sodium chloride 0.9 % bolus 1,000 mL (0 mLs Intravenous Stopped 07/09/19 0255)  morphine 4 MG/ML injection 4 mg (4 mg Intravenous  Given 07/09/19 0105)  ondansetron (ZOFRAN) injection 4 mg (4 mg Intravenous Given 07/09/19 0104)  dexamethasone (DECADRON) injection 6 mg (6 mg Intravenous Given 07/09/19 0333)     ED Discharge Orders         Ordered    azithromycin (ZITHROMAX Z-PAK) 250 MG tablet     07/09/19 0353    predniSONE (DELTASONE) 20 MG tablet  Daily     07/09/19 0353    albuterol (VENTOLIN HFA) 108 (90 Base) MCG/ACT inhaler  Every 6 hours PRN     07/09/19 0353    promethazine (PHENERGAN) 12.5 MG tablet  Every 6 hours PRN     07/09/19 0353           Note:  This document was prepared using Dragon voice recognition software and may include unintentional dictation errors.   Duffy Bruce, MD 07/09/19 0400    Duffy Bruce, MD 07/09/19 0400

## 2019-07-08 NOTE — ED Triage Notes (Addendum)
Pt to triage via wheelchair.  Pt reports dizziness with chest pain and sob.  Fever since last night.  Pt has a dry cough.  No n/v/d  Pt alert speech clear.

## 2019-07-09 LAB — URINALYSIS, COMPLETE (UACMP) WITH MICROSCOPIC
Bacteria, UA: NONE SEEN
Bilirubin Urine: NEGATIVE
Glucose, UA: NEGATIVE mg/dL
Ketones, ur: NEGATIVE mg/dL
Leukocytes,Ua: NEGATIVE
Nitrite: NEGATIVE
Protein, ur: 30 mg/dL — AB
Specific Gravity, Urine: 1.033 — ABNORMAL HIGH (ref 1.005–1.030)
pH: 5 (ref 5.0–8.0)

## 2019-07-09 LAB — HEPATIC FUNCTION PANEL
ALT: 18 U/L (ref 0–44)
AST: 21 U/L (ref 15–41)
Albumin: 4.6 g/dL (ref 3.5–5.0)
Alkaline Phosphatase: 34 U/L — ABNORMAL LOW (ref 38–126)
Bilirubin, Direct: <0.1 mg/dL (ref 0.0–0.2)
Total Bilirubin: 0.7 mg/dL (ref 0.3–1.2)
Total Protein: 8 g/dL (ref 6.5–8.1)

## 2019-07-09 LAB — RESPIRATORY PANEL BY RT PCR (FLU A&B, COVID)
Influenza A by PCR: NEGATIVE
Influenza B by PCR: NEGATIVE
SARS Coronavirus 2 by RT PCR: POSITIVE — AB

## 2019-07-09 LAB — LIPASE, BLOOD: Lipase: 18 U/L (ref 11–51)

## 2019-07-09 LAB — TROPONIN I (HIGH SENSITIVITY): Troponin I (High Sensitivity): 3 ng/L (ref <18)

## 2019-07-09 MED ORDER — KETOROLAC TROMETHAMINE 30 MG/ML IJ SOLN
15.0000 mg | Freq: Once | INTRAMUSCULAR | Status: AC
  Administered 2019-07-09: 01:00:00 15 mg via INTRAVENOUS
  Filled 2019-07-09: qty 1

## 2019-07-09 MED ORDER — SODIUM CHLORIDE 0.9 % IV BOLUS: 1000.0000 mL | Freq: Once | INTRAVENOUS | Status: DC

## 2019-07-09 MED ORDER — PREDNISONE 20 MG PO TABS
40.0000 mg | ORAL_TABLET | Freq: Every day | ORAL | 0 refills | Status: AC
Start: 2019-07-09 — End: 2019-07-14

## 2019-07-09 MED ORDER — AZITHROMYCIN 250 MG PO TABS: ORAL_TABLET | ORAL | 0 refills | Status: DC

## 2019-07-09 MED ORDER — MORPHINE SULFATE (PF) 4 MG/ML IV SOLN
4.0000 mg | Freq: Once | INTRAVENOUS | Status: AC
  Administered 2019-07-09: 01:00:00 4 mg via INTRAVENOUS
  Filled 2019-07-09: qty 1

## 2019-07-09 MED ORDER — SODIUM CHLORIDE 0.9 % IV SOLN: 2.0000 g | Freq: Once | INTRAVENOUS | Status: DC

## 2019-07-09 MED ORDER — DEXAMETHASONE SODIUM PHOSPHATE 10 MG/ML IJ SOLN
6.0000 mg | Freq: Once | INTRAMUSCULAR | Status: AC
  Administered 2019-07-09: 04:00:00 6 mg via INTRAVENOUS
  Filled 2019-07-09: qty 1

## 2019-07-09 MED ORDER — ONDANSETRON HCL 4 MG/2ML IJ SOLN
4.0000 mg | Freq: Once | INTRAMUSCULAR | Status: AC
  Administered 2019-07-09: 4 mg via INTRAVENOUS
  Filled 2019-07-09: qty 2

## 2019-07-09 MED ORDER — SODIUM CHLORIDE 0.9 % IV BOLUS
1000.0000 mL | Freq: Once | INTRAVENOUS | Status: AC
  Administered 2019-07-09: 02:00:00 1000 mL via INTRAVENOUS

## 2019-07-09 MED ORDER — ALBUTEROL SULFATE HFA 108 (90 BASE) MCG/ACT IN AERS: 2.0000 | INHALATION_SPRAY | Freq: Four times a day (QID) | RESPIRATORY_TRACT | 0 refills | Status: AC | PRN

## 2019-07-09 MED ORDER — PROMETHAZINE HCL 12.5 MG PO TABS
25.0000 mg | ORAL_TABLET | Freq: Four times a day (QID) | ORAL | 0 refills | Status: DC | PRN
Start: 2019-07-09 — End: 2019-08-12

## 2019-07-09 NOTE — ED Notes (Signed)
Pt was walked around the room with O2/ cardiac monitoring in place. Pt did not de-sat while ambulating. MD notified

## 2019-07-09 NOTE — Discharge Instructions (Signed)
Take the prescribed medications  Follow work-place and CDC guidance for COVID restrictions  No working until symptom free for at least 72 hours  If you're interested in receiving COVID antibody infusions, call the [facility] Help line provided

## 2019-07-09 NOTE — ED Notes (Addendum)
Pt lying in bed asleep at this time, Pt's visitor at bedside. Pt's VS are visible on the monitor and WNL. Nothing needed from staff at this time

## 2019-07-10 ENCOUNTER — Telehealth: Payer: Self-pay | Admitting: Unknown Physician Specialty

## 2019-07-10 ENCOUNTER — Other Ambulatory Visit: Payer: Self-pay | Admitting: Unknown Physician Specialty

## 2019-07-10 DIAGNOSIS — U071 COVID-19: Secondary | ICD-10-CM

## 2019-07-10 MED ORDER — SODIUM CHLORIDE 0.9 % IV SOLN
Freq: Once | INTRAVENOUS | Status: AC
  Filled 2019-07-10: qty 700

## 2019-07-10 NOTE — Telephone Encounter (Signed)
  I connected by phone with [name] on 07/10/2019 at 8:00 AM to discuss the potential use of an new treatment for mild to moderate COVID-19 viral infection in non-hospitalized patients.  This patient is a 49 y.o. female that meets the FDA criteria for Emergency Use Authorization of bamlanivimab/etesevimab or casirivimab/imdevimab.  Has a (+) direct SARS-CoV-2 viral test result  Has mild or moderate COVID-19   Is ? 49 years of age and weighs ? 40 kg  Is NOT hospitalized due to COVID-19  Is NOT requiring oxygen therapy or requiring an increase in baseline oxygen flow rate due to COVID-19  Is within 10 days of symptom onset  Has at least one of the high risk factor(s) for progression to severe COVID-19 and/or hospitalization as defined in EUA.  Specific high risk criteria : BMI >/= 35   I have spoken and communicated the following to the patient or parent/caregiver:  1. FDA has authorized the emergency use of bamlanivimab/etesevimab and casirivimab\imdevimab for the treatment of mild to moderate COVID-19 in adults and pediatric patients with positive results of direct SARS-CoV-2 viral testing who are 68 years of age and older weighing at least 40 kg, and who are at high risk for progressing to severe COVID-19 and/or hospitalization.  2. The significant known and potential risks and benefits of bamlanivimab/etesevimab and casirivimab\imdevimab, and the extent to which such potential risks and benefits are unknown.  3. Information on available alternative treatments and the risks and benefits of those alternatives, including clinical trials.  4. Patients treated with bamlanivimab/etesevimab and casirivimab\imdevimab should continue to self-isolate and use infection control measures (e.g., wear mask, isolate, social distance, avoid sharing personal items, clean and disinfect "high touch" surfaces, and frequent handwashing) according to CDC guidelines.   5. The patient or  parent/caregiver has the option to accept or refuse bamlanivimab/etesevimab or casirivimab\imdevimab .  After reviewing this information with the patient, The patient agreed to proceed with receiving the bamlanimivab infusion and will be provided a copy of the Fact sheet prior to receiving the infusion.[name] 07/10/2019 8:00 AM  Sx onset 5/2

## 2019-07-11 ENCOUNTER — Ambulatory Visit (HOSPITAL_COMMUNITY)
Admission: RE | Admit: 2019-07-11 | Discharge: 2019-07-11 | Disposition: A | Discharge status: Home / Self Care | Payer: 59 | Source: Ambulatory Visit | Attending: Pulmonary Disease | Admitting: Pulmonary Disease

## 2019-07-11 DIAGNOSIS — U071 COVID-19: Secondary | ICD-10-CM | POA: Diagnosis present

## 2019-07-11 MED ORDER — SODIUM CHLORIDE 0.9 % IV SOLN: INTRAVENOUS | Status: DC | PRN

## 2019-07-11 MED ORDER — ALBUTEROL SULFATE HFA 108 (90 BASE) MCG/ACT IN AERS: 2.0000 | INHALATION_SPRAY | Freq: Once | RESPIRATORY_TRACT | Status: DC | PRN

## 2019-07-11 MED ORDER — DIPHENHYDRAMINE HCL 50 MG/ML IJ SOLN: 50.0000 mg | Freq: Once | INTRAMUSCULAR | Status: DC | PRN

## 2019-07-11 MED ORDER — FAMOTIDINE IN NACL 20-0.9 MG/50ML-% IV SOLN: 20.0000 mg | Freq: Once | INTRAVENOUS | Status: DC | PRN

## 2019-07-11 MED ORDER — METHYLPREDNISOLONE SODIUM SUCC 125 MG IJ SOLR: 125.0000 mg | Freq: Once | INTRAMUSCULAR | Status: DC | PRN

## 2019-07-11 MED ORDER — EPINEPHRINE 0.3 MG/0.3ML IJ SOAJ: 0.3000 mg | Freq: Once | INTRAMUSCULAR | Status: DC | PRN

## 2019-07-11 NOTE — Progress Notes (Signed)
  Diagnosis: COVID-19  Physician: Dr. Wright  Procedure: [facility] Med: bamlanivimab\etesevimab infusion - Provided patient with bamlanimivab\etesevimab fact sheet for patients, parents and caregivers prior to infusion.  Complications: No immediate complications noted.  Discharge: Discharged home   Keerthana Vanrossum 07/11/2019   

## 2019-07-11 NOTE — Discharge Instructions (Signed)

## 2019-08-12 ENCOUNTER — Ambulatory Visit (INDEPENDENT_AMBULATORY_CARE_PROVIDER_SITE_OTHER): Payer: 59 | Admitting: Podiatry

## 2019-08-12 ENCOUNTER — Other Ambulatory Visit: Payer: Self-pay

## 2019-08-12 ENCOUNTER — Encounter: Payer: Self-pay | Admitting: Podiatry

## 2019-08-12 DIAGNOSIS — R234 Changes in skin texture: Secondary | ICD-10-CM

## 2019-08-12 NOTE — Progress Notes (Signed)
Subjective:  Patient ID: Melanie Lester, female    DOB: 1970-04-12,  MRN: [id number] HPI Chief Complaint  Patient presents with  . Foot Pain    Patient presents today for painful cuts on bottoms of bilat feet x 4-5 months.  She states "they almost heel up then open back up.  they send sharp pains through my feet, stings and burns"  She has used diffrents socks, shoes, inserts, and foot fungal creams with no relief    49 y.o. female presents with the above complaint.   ROS: Denies fever chills nausea vomiting muscle aches pains calf pain back pain chest pain shortness of breath.  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  States that she is on her feet all day.  Past Medical History:  Diagnosis Date  . Hypertension    Past Surgical History:  Procedure Laterality Date  . ABDOMINAL HYSTERECTOMY    . CESAREAN SECTION    . CHOLECYSTECTOMY      Current Outpatient Medications:  .  albuterol (VENTOLIN HFA) 108 (90 Base) MCG/ACT inhaler, Inhale 2 puffs into the lungs every 6 (six) hours as needed for wheezing or shortness of breath., Disp: 6.7 g, Rfl: 0 .  chlorthalidone (HYGROTON) 25 MG tablet, Take by mouth., Disp: , Rfl:  .  cyanocobalamin 100 MCG tablet, Take by mouth., Disp: , Rfl:  .  cyclobenzaprine (FLEXERIL) 5 MG tablet, Take 1 tablet (5 mg total) by mouth at bedtime., Disp: 15 tablet, Rfl: 0 .  diclofenac (VOLTAREN) 50 MG EC tablet, diclofenac sodium 50 mg tablet,delayed release  1 BY MOUTH TWICE DAILY AS NEEDED FOR PAIN TAKE WITH FOOD, Disp: , Rfl:  .  ergocalciferol (VITAMIN D2) 1.25 MG (50000 UT) capsule, ergocalciferol (vitamin D2) 1,250 mcg (50,000 unit) capsule  TAKE 1 CAPSULE BY MOUTH WEEKLY FOR 12 WEEKS, Disp: , Rfl:  .  furosemide (LASIX) 20 MG tablet, Take 20 mg by mouth daily., Disp: , Rfl:  .  ketorolac (TORADOL) 10 MG tablet, Take 1 tablet (10 mg total) by mouth every 8 (eight) hours., Disp: 15 tablet, Rfl: 0 .  Naproxen  Sodium 220 MG CAPS, Take by mouth., Disp: , Rfl:   Allergies  Allergen Reactions  . Shellfish Allergy Anaphylaxis  . Clindamycin/Lincomycin Swelling  . Codeine Swelling  . Latex Rash   Review of Systems Objective:  There were no vitals filed for this visit.  General: Well developed, nourished, in no acute distress, alert and oriented x3   Dermatological: Skin is warm, dry and supple bilateral. Nails x 10 are well maintained; remaining integument appears unremarkable at this time. There are no open sores, no preulcerative lesions, no rash or signs of infection present.  Skin fissures not to dermis beneath the fifth metatarsal head extending to middle diaphyseal region.  These fissures are transverse appear to be friction related most likely associated with shoes.  No signs of infection no open lesions or wounds.  Vascular: Dorsalis Pedis artery and Posterior Tibial artery pedal pulses are 2/4 bilateral with immedate capillary fill time. Pedal hair growth present. No varicosities and no lower extremity edema present bilateral.   Neruologic: Grossly intact via light touch bilateral. Vibratory intact via tuning fork bilateral. Protective threshold with Semmes Wienstein monofilament intact to all pedal sites bilateral. Patellar and Achilles deep tendon reflexes 2+ bilateral. No Babinski or clonus noted bilateral.   Musculoskeletal: No gross boney pedal deformities bilateral. No pain, crepitus, or limitation noted with  foot and ankle range of motion bilateral. Muscular strength 5/5 in all groups tested bilateral.  Gait: Unassisted, Nonantalgic.    Radiographs:  None taken  Assessment & Plan:   Assessment: Skin fissures forefoot bilateral  Plan: Discussed topical over-the-counter O'Keefe's emollient cream and discussed appropriate shoe gear.     Diamonte Stavely T. [place], [place]

## 2019-09-13 ENCOUNTER — Other Ambulatory Visit: Payer: Self-pay | Admitting: Family Medicine

## 2019-09-13 DIAGNOSIS — Z1231 Encounter for screening mammogram for malignant neoplasm of breast: Secondary | ICD-10-CM

## 2019-10-07 ENCOUNTER — Ambulatory Visit: Payer: 59 | Attending: Family Medicine | Admitting: Physical Therapy

## 2019-10-07 ENCOUNTER — Encounter: Payer: Self-pay | Admitting: Physical Therapy

## 2019-10-07 ENCOUNTER — Other Ambulatory Visit: Payer: Self-pay

## 2019-10-07 DIAGNOSIS — M6281 Muscle weakness (generalized): Secondary | ICD-10-CM | POA: Diagnosis present

## 2019-10-07 DIAGNOSIS — R2689 Other abnormalities of gait and mobility: Secondary | ICD-10-CM | POA: Insufficient documentation

## 2019-10-07 DIAGNOSIS — R278 Other lack of coordination: Secondary | ICD-10-CM | POA: Insufficient documentation

## 2019-10-07 NOTE — Patient Instructions (Signed)
°  Proper body mechanics with getting out of a chair to decrease strain  on back &pelvic floor   Avoid holding your breath when Getting out of the chair:  Scoot to front part of chair chair Heels behind feet, feet are hip width apart, nose over toes  Inhale like you are smelling roses Exhale to stand    ___   Avoid straining pelvic floor, abdominal muscles , spine  Use log rolling technique instead of getting out of bed with your neck or the sit-up     Log rolling into and out of bed   Log rolling into and out of bed If getting out of bed on R side, Bent knees, scoot hips/ shoulder to L  Raise R arm completely overhead, rolling onto armpit  Then lower bent knees to bed to get into complete side lying position  Then drop legs off bed, and push up onto R elbow/forearm, and use L hand to push onto the bed    ___  Functional positions  THINK 4 POINTS OF CONTACT   At work when standing  Semi tandem stand , 45 deg turn to client , knees bent  Facing client with knees bent   Seated:  feet on ground , not crossed   Stand with equal weight on both legs not just on one  ___  Figure stretch seated 5 breaths   Knee to chest with towel under thigh , shoulders/ elbows down  5 breaths   ___

## 2019-10-08 ENCOUNTER — Encounter: Payer: Self-pay | Admitting: Physical Therapy

## 2019-10-08 NOTE — Therapy (Signed)
[place] [facility] MAIN [facility] SERVICES [address] [place], [place], [address] Phone: [phone]   Fax:  [phone]  Physical Therapy Evaluation  Patient Details  Name: Melanie Lester MRN: [id number] Date of Birth: December 15, 1970 Referring Provider (PT): Lorenda Cahill, FNP   Encounter Date: 10/07/2019   PT End of Session - 10/07/19 1457    Visit Number 1    Number of Visits 10    Date for PT Re-Evaluation 12/16/19    PT Start Time 1400    PT Stop Time 1500    PT Time Calculation (min) 60 min           Past Medical History:  Diagnosis Date  . COVID-24 Jul 2019   . Hypertension     Past Surgical History:  Procedure Laterality Date  . ABDOMINAL HYSTERECTOMY    . CESAREAN SECTION    . CHOLECYSTECTOMY      There were no vitals filed for this visit.    Subjective Assessment - 10/07/19 1425    Subjective Pelvic pain, urge incontinence, SUI:  These issues started started 3-4 years ago her hysterectomy. Durign this time, she was also having issues with sexual intercourse with pain and also needing get to the bathroom quickly before leakage. Currently, pt does still have pain with sex at a level 2-3/10 during the activity and pain lingers for 30 min. Currently, pt las leakage with every trip to the urination before getting to the toilet. Pt does not have to strain with bowel movements and they occur daily. Daily fluid intake: 75 fl oz of water, 44 fl oz soda. Physical activity: walking 1.5 miles with dogs. Prior to COVID, pt went to the gym and performed crunches, ellipitical, treadmill, ab workout. Denied LBP. Pt sustained a fall during May when she was dizzy 2/2 COVID Sx and she fell onto her back. Denied issues with hips, knees, ankles, feet. 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 Pt notices her leakage occurs more at work when bending.    Pertinent History vaginal hysterectomy, C-section, Cholectectomy,               [facility] PT Assessment - 10/08/19 1358      Assessment   Medical Diagnosis SUI    Referring Provider (PT) Lorenda Cahill, FNP      Precautions   Precautions None      Restrictions   Weight Bearing Restrictions No      Balance Screen   Has the patient fallen in the past 6 months No      Observation/Other Assessments   Observations ankles crossed in seated position      Single Leg Stance   Comments B SLS with difficulty 2 sec, R SLS 1 sec       Other:   Other/ Comments simulated standing posture:  hyperextended knees,       AROM   Overall AROM Comments L sideflexion < R,       Strength   Overall Strength Comments R hip flex 3/5, L 4/5, knee ext/flex 4/5 B, R hip ext 2/5, L 4/5 , B hip abd 4/5         Palpation   SI assessment  R SIJ hypomobile FADDIR  , C-section scar restrictions      Bed Mobility   Bed Mobility --   head lift  Objective measurements completed on examination: See above findings.     Pelvic Floor Special Questions - 10/07/19 1445    Diastasis Recti abdominal bulging     External Perineal Exam through clothing: tight anterior pelvic floor mm , cued for contraction which showed minimal upward movement             [facility] Adult PT Treatment/Exercise - 10/08/19 1358      Neuro Re-ed    Neuro Re-ed Details  cued for body mechanics       Manual Therapy   Manual therapy comments R long axis distraction, AP mob. rotational mob to promote SIJ FADDIR                        PT Long Term Goals - 10/07/19 1419      PT LONG TERM GOAL #1   Title Pt will report no leakage with every trip to trip to the toilet for urination in order perform activities in the community    Time 8    Period Weeks    Status New    Target Date 12/02/19      PT LONG TERM GOAL #2   Title Pt will experience decreased pelvic pain from 2-3/10 to < 1 /10 in order to improve QOL    Time 6    Period Weeks    Status New    Target Date  11/18/19      PT LONG TERM GOAL #3   Title Pt will improve her FOTO score Urinary 43 pts to > 48 pts in order to improve QOL    Time 10    Period Weeks    Status New    Target Date 12/17/19      PT LONG TERM GOAL #4   Title Pt will report no  leakage  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 Pt's musculoskeletal assessment revealed limited spinal /pelvic mobility, dyscoordination and strength of pelvic floor mm, weak hip weakness, poor balance, SIJ hypomobility, and poor body mechanics which places strain on the abdominal/pelvic floor mm. These are deficits that indicate an ineffective intraabdominal pressure system associated with increased risk for pt's Sx. Past surgeries impact her Sx as well:  vaginal hysterectomy, C-section, cholectectomy.  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 Pt will benefit from proper coordination training and education on fitness and functional positions in order to yield greater outcomes as pt performed sit-ups/ crunches in the past. Advised pt to not perform sit-ups and crunches  as these movement patterns lead to more downward forces on her pelvic floor, negatively impacting abdominopelvic/spinal dysfunctions.   Pt was provided education on etiology of Sx with anatomy, physiology explanation with images along with the benefits of  customized pelvic PT Tx based on pt's medical conditions and musculoskeletal deficits.  Explained the physiology of deep core mm coordination and roles of pelvic floor function in urination, defecation, sexual function, and postural control with deep core mm system.   Following Tx today which pt tolerated without complaints, pt demo'd equal alignment of pelvic girdle and increased spinal mobility.  Plan to decrease C-section scar and perform pelvic assessment at upcoming session. Pt benefits from skilled PT.      Examination-Activity Limitations Stand;Toileting    Stability/Clinical Decision Making Evolving/Moderate complexity    Clinical Decision Making Moderate    Rehab Potential Good    PT Frequency 1x / week    PT Duration Other (comment)   10   PT Treatment/Interventions Neuromuscular re-education;Scar mobilization;Functional mobility training;Gait training;Therapeutic activities;Therapeutic exercise;Patient/family education;Moist Heat;Manual techniques;Taping;Balance training;Cryotherapy    Consulted and Agree with Plan of Care Patient           Patient will benefit from skilled therapeutic intervention in order to improve the following deficits and impairments:  Hypomobility, Decreased scar mobility, Decreased mobility, Decreased coordination, Decreased range of motion, Decreased endurance, Decreased activity tolerance, Improper body mechanics, Increased fascial restricitons, Postural dysfunction, Pain, Difficulty walking, Increased edema, Decreased balance  Visit Diagnosis: Other lack of coordination  Muscle weakness (generalized)  Other abnormalities of gait and mobility     Problem List Patient Active Problem List   Diagnosis Date Noted  . 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 Essential hypertension 03/14/2019    [name] ,PT, DPT, E-RYT  10/08/2019, 2:01 PM  [place] [facility] MAIN [facility] SERVICES [address]  [place], [place], [address] Phone: [phone]   Fax:  [phone]  Name: JAZMYNE BEAUCHESNE MRN: [id number] Date of Birth: 1970/03/16

## 2019-10-14 ENCOUNTER — Other Ambulatory Visit: Payer: Self-pay

## 2019-10-14 ENCOUNTER — Ambulatory Visit: Payer: 59 | Admitting: Physical Therapy

## 2019-10-14 DIAGNOSIS — M6281 Muscle weakness (generalized): Secondary | ICD-10-CM

## 2019-10-14 DIAGNOSIS — R2689 Other abnormalities of gait and mobility: Secondary | ICD-10-CM

## 2019-10-14 DIAGNOSIS — R278 Other lack of coordination: Secondary | ICD-10-CM | POA: Diagnosis not present

## 2019-10-14 NOTE — Therapy (Signed)
[place] [facility] MAIN [facility] SERVICES [address] [place], [place], [address] Phone: [phone]   Fax:  [phone]  Physical Therapy Treatment  Patient Details  Name: Melanie Lester MRN: [id number] Date of Birth: 08-May-1970 Referring Provider (PT): Lorenda Cahill, FNP   Encounter Date: 10/14/2019   PT End of Session - 10/14/19 1443    Visit Number 2    Number of Visits 10    Date for PT Re-Evaluation 12/16/19    PT Start Time 1400    PT Stop Time 1500    PT Time Calculation (min) 60 min           Past Medical History:  Diagnosis Date  . COVID-24 Jul 2019   . Hypertension     Past Surgical History:  Procedure Laterality Date  . CESAREAN SECTION    . CHOLECYSTECTOMY    . VAGINAL HYSTERECTOMY      There were no vitals filed for this visit.   Subjective Assessment - 10/14/19 1402    Subjective Pt is trying to practice getting out of the chair, sitting, standing at work.    Pertinent History vaginal hysterectomy, C-section, Cholectectomy,              [facility] PT Assessment - 10/14/19 1435      AROM   Overall AROM Comments sideflexion WFL B       Palpation   SI assessment  FADDIR B without restrictions     Palpation comment Fascial restrictions along C-section scar L > R                           [facility] Adult PT Treatment/Exercise - 10/14/19 1437      Neuro Re-ed    Neuro Re-ed Details  cued for deep core and open book HEP       Modalities   Modalities Moist Heat      Moist Heat Therapy   Number Minutes Moist Heat 5 Minutes    Moist Heat Location Other (comment)   abdomen     Manual Therapy   Manual therapy comments fascial releases over  C-section scar                         PT Long Term Goals - 10/07/19 1419      PT LONG TERM GOAL #1   Title Pt will report no leakage with every trip to trip to the toilet for urination in order perform activities in the community    Time 8     Period Weeks    Status New    Target Date 12/02/19      PT LONG TERM GOAL #2   Title Pt will experience decreased pelvic pain from 2-3/10 to < 1 /10 in order to improve QOL    Time 6    Period Weeks    Status New    Target Date 11/18/19      PT LONG TERM GOAL #3   Title Pt will improve her FOTO score Urinary 43 pts to > 48 pts in order to improve QOL    Time 10    Period Weeks    Status New    Target Date 12/17/19      PT LONG TERM GOAL #4   Title 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 Pt advanced to deep core strengthening and thoracic mobility HEP with cues. Pt continues to benefit from skilled PT.     Examination-Activity Limitations Stand;Toileting    Stability/Clinical Decision Making Evolving/Moderate complexity    Rehab Potential Good    PT Frequency 1x / week    PT Duration Other (comment)   10   PT Treatment/Interventions Neuromuscular re-education;Scar mobilization;Functional mobility training;Gait training;Therapeutic activities;Therapeutic exercise;Patient/family education;Moist Heat;Manual techniques;Taping;Balance training;Cryotherapy    Consulted and Agree with Plan of Care Patient           Patient will benefit from skilled therapeutic intervention in order to improve the following deficits and impairments:  Hypomobility,  Decreased scar mobility, Decreased mobility, Decreased coordination, Decreased range of motion, Decreased endurance, Decreased activity tolerance, Improper body mechanics, Increased fascial restricitons, Postural dysfunction, Pain, Difficulty walking, Increased edema, Decreased balance  Visit Diagnosis: Other lack of coordination  Muscle weakness (generalized)  Other abnormalities of gait and mobility     Problem List Patient Active Problem List   Diagnosis Date Noted  . 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 Essential hypertension 03/14/2019    [name] ,PT, DPT, E-RYT  10/14/2019, 2:49 PM  [place] [facility] MAIN [facility] SERVICES [address] [place], [place], [address] Phone: [phone]   Fax:  [phone]  Name: Melanie Lester MRN: [id number] Date of Birth: 23-Nov-1970

## 2019-10-14 NOTE — Patient Instructions (Addendum)
   Deep core level ( handout )  X 2 xday     shoulder stretches:   _Doorway squat : bear stretch for shoulder blades at work   [employer] ( handout)  X 2 day

## 2019-10-21 ENCOUNTER — Other Ambulatory Visit: Payer: Self-pay

## 2019-10-21 ENCOUNTER — Ambulatory Visit: Payer: 59 | Admitting: Physical Therapy

## 2019-10-21 DIAGNOSIS — M6281 Muscle weakness (generalized): Secondary | ICD-10-CM

## 2019-10-21 DIAGNOSIS — R278 Other lack of coordination: Secondary | ICD-10-CM

## 2019-10-21 DIAGNOSIS — R2689 Other abnormalities of gait and mobility: Secondary | ICD-10-CM

## 2019-10-21 NOTE — Therapy (Signed)
[facility] MAIN [facility] SERVICES [address] [place], [place], [address] Phone: [phone]   Fax:  [phone]  Patient Details  Name: Melanie Lester MRN: [id number] Date of Birth: 1970-07-10 Referring Provider:  Lorn Junes, FNP  Encounter Date: 10/21/2019  Pt arrived reporting she just left her chiropractor's office where she was getting Tx for her neck but she was delayed because upon getting up off one of the machines there,. She felt pain in upper Q of her abdomen , a bulging and she described it like mm strain. The DC massaged it and pain decreased some.   Pt appeared antalgic as she was rising from the chair.  Discussed the importance of withholding PT today and allowing for her Sx to subside and if pain persists , pt needs to get medical attention. Pt voiced understanding.    Pt and DPT agreed to resume PT next week.    Melanie Lester ,PT, DPT, E-RYT   10/21/2019, 3:00 PM  [place] [facility] MAIN [facility] SERVICES [address] [place], [place], [address] Phone: [phone]   Fax:  [phone]

## 2019-10-24 ENCOUNTER — Other Ambulatory Visit: Payer: Self-pay

## 2019-10-24 ENCOUNTER — Ambulatory Visit
Admission: RE | Admit: 2019-10-24 | Discharge: 2019-10-24 | Disposition: A | Discharge status: Home / Self Care | Payer: 59 | Source: Ambulatory Visit | Attending: Family Medicine | Admitting: Family Medicine

## 2019-10-24 DIAGNOSIS — Z1231 Encounter for screening mammogram for malignant neoplasm of breast: Secondary | ICD-10-CM | POA: Insufficient documentation

## 2019-10-28 ENCOUNTER — Ambulatory Visit: Payer: 59 | Admitting: Physical Therapy

## 2019-10-28 ENCOUNTER — Other Ambulatory Visit: Payer: Self-pay

## 2019-10-28 DIAGNOSIS — R2689 Other abnormalities of gait and mobility: Secondary | ICD-10-CM

## 2019-10-28 DIAGNOSIS — M6281 Muscle weakness (generalized): Secondary | ICD-10-CM

## 2019-10-28 DIAGNOSIS — R278 Other lack of coordination: Secondary | ICD-10-CM | POA: Diagnosis not present

## 2019-10-28 NOTE — Therapy (Signed)
[place] [facility] MAIN [facility] SERVICES [address] [place], [place], [address] Phone: [phone]   Fax:  [phone]  Physical Therapy Treatment  Patient Details  Name: JONQUIL STUBBE MRN: [id number] Date of Birth: 06/30/1970 Referring Provider (PT): Lorenda Cahill, FNP   Encounter Date: 10/28/2019   PT End of Session - 10/28/19 1613    Visit Number 3    Number of Visits 10    Date for PT Re-Evaluation 12/16/19    PT Start Time 1400    PT Stop Time 1500    PT Time Calculation (min) 60 min           Past Medical History:  Diagnosis Date  . COVID-24 Jul 2019   . Hypertension     Past Surgical History:  Procedure Laterality Date  . CESAREAN SECTION    . CHOLECYSTECTOMY    . VAGINAL HYSTERECTOMY      There were no vitals filed for this visit.   Subjective Assessment - 10/28/19 1406    Subjective Pt reported the throbbing pain in her upper R abdomen that started after her chiropractic Tx last week on Monday subsided that afternoon.  Pt felt the PT exercises that she learned the week prior  have helped alot. Pt "see an improvement and is able to make to make it to the bathroom " whereas she was not able to before.    Pertinent History vaginal hysterectomy, C-section, Cholectectomy,              [facility] PT Assessment - 10/28/19 1408      PROM   Overall PROM Comments R hip ext more restricted than L ( Post Tx: increased hip ext on R)        Strength   Overall Strength Comments B hip flex 5/5, knee ext/flex 5/5 B,  hip ext 4-/5 B, B hip abd 4-/5         Palpation   Palpation comment tightness at B calves, peroneal longus/brevis/ tib ant B, intrinsic feet mm, hypomobile midfoot                          [facility] Adult PT Treatment/Exercise - 10/28/19 1451      Therapeutic Activites    Other Therapeutic Activities explained legs elevation after work to minimize ankle swelling and better circulate after standing all  day,       Modalities   Modalities Moist Heat      Moist Heat Therapy   Number Minutes Moist Heat 5 Minutes    Moist Heat Location --   abdomen     Manual Therapy   Manual therapy comments STM/MWM to minimize tightness at B calves, peroneal longus/brevis/ tib ant B, intrinsic feet mm, hypomobile midfoo                       PT Long Term Goals - 10/15/19 0914      PT LONG TERM GOAL #1   Title Pt will report no leakage with every trip to trip to the toilet for urination in order perform activities in the community    Time 8    Period Weeks    Status New      PT LONG TERM GOAL #2   Title Pt will experience decreased pelvic pain from 2-3/10 to < 1 /10 in order to improve QOL    Time 6  Period Weeks    Status New      PT LONG TERM GOAL #3   Title Pt will improve her FOTO score Urinary 43 pts to > 48 pts in order to improve QOL    Time 10    Period Weeks    Status New      PT LONG TERM GOAL #4   Title Pt will report no  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 Pt demo'd increased C-section scar restrictions on R > L which limited hip extension. Following manual Tx which pt tolerated without increased pain, pt achieved hip ext on R. Pt also tolerated manual Tx to minimize leg/ feet mm tightness and was educated on ways to elevate her feet after standing all day at work to minimize ankle/ feet swelling. Pt continues to benefit from skilled PT    Examination-Activity Limitations  Stand;Toileting    Stability/Clinical Decision Making Evolving/Moderate complexity    Rehab Potential Good    PT Frequency 1x / week    PT Duration Other (comment)   10   PT Treatment/Interventions Neuromuscular re-education;Scar mobilization;Functional mobility training;Gait training;Therapeutic activities;Therapeutic exercise;Patient/family education;Moist Heat;Manual techniques;Taping;Balance training;Cryotherapy    Consulted and Agree with Plan of Care Patient           Patient will benefit from skilled therapeutic intervention in order to improve the following deficits and impairments:  Hypomobility, Decreased scar mobility, Decreased mobility, Decreased coordination, Decreased range of motion, Decreased endurance, Decreased activity tolerance, Improper body mechanics, Increased fascial restricitons, Postural dysfunction, Pain, Difficulty walking, Increased edema, Decreased balance  Visit Diagnosis: Other lack of coordination  Muscle weakness (generalized)  Other abnormalities of gait and mobility     Problem List Patient Active Problem List   Diagnosis Date Noted  . 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 Essential hypertension 03/14/2019    [name] ,PT, DPT, E-RYT  10/28/2019, 4:47 PM  [place] [facility] MAIN [facility] SERVICES [address] [place], [place], [address] Phone: [phone]   Fax:  [phone]  Name: CHERYLENE FERRUFINO MRN: [id number] Date of Birth: 02-02-1971

## 2019-10-28 NOTE — Patient Instructions (Signed)
Ankle circles, pumps  ankles/ leg elevated over pillows, head and heart levelled on bed   10 min after work   __  double ankle heel raises at work 10 reps x 3 x day

## 2019-11-04 ENCOUNTER — Ambulatory Visit: Payer: 59 | Admitting: Physical Therapy

## 2019-11-04 ENCOUNTER — Other Ambulatory Visit: Payer: Self-pay

## 2019-11-04 DIAGNOSIS — R278 Other lack of coordination: Secondary | ICD-10-CM

## 2019-11-04 DIAGNOSIS — M6281 Muscle weakness (generalized): Secondary | ICD-10-CM

## 2019-11-04 DIAGNOSIS — R2689 Other abnormalities of gait and mobility: Secondary | ICD-10-CM

## 2019-11-04 NOTE — Therapy (Signed)
[place] [facility] MAIN [facility] SERVICES [address] [place], [place], [address] Phone: [phone]   Fax:  [phone]  Physical Therapy Treatment  Patient Details  Name: Melanie Lester MRN: [id number] Date of Birth: [birth year] Referring Provider (PT): Lorenda Cahill, FNP   Encounter Date: 11/04/2019   PT End of Session - 11/04/19 1540    Visit Number 4    Number of Visits 10    Date for PT Re-Evaluation 12/16/19    PT Start Time 1400    PT Stop Time 1500    PT Time Calculation (min) 60 min           Past Medical History:  Diagnosis Date  . COVID-24 Jul 2019   . Hypertension     Past Surgical History:  Procedure Laterality Date  . CESAREAN SECTION    . CHOLECYSTECTOMY    . VAGINAL HYSTERECTOMY      There were no vitals filed for this visit.   Subjective Assessment - 11/04/19 1410    Subjective Pt reported she felt "great" after last session. She had only 2 leakage accidents last week whereas in the past week, pt had daily accidents. Pt felt less cramps in her calves after last session. Pt has been doing her leg stretches.    Pertinent History vaginal hysterectomy, C-section, Cholectectomy,              [facility] PT Assessment - 11/04/19 1541      Palpation   Palpation comment C-section scar ( medial R aspect more restrictions)                       Pelvic Floor Special Questions - 11/04/19 1544    External Perineal Exam without clothing, R anterior/ posterior mm tightness > L              [facility] Adult PT Treatment/Exercise - 11/04/19 1540      Neuro Re-ed    Neuro Re-ed Details  cued for proper deep core level 2       Modalities   Modalities Moist Heat      Moist Heat Therapy   Number Minutes Moist Heat 5 Minutes    Moist Heat Location --   abdominal      Manual Therapy   Manual therapy comments fascial releases over  C-section scar, externally at anterior / posterior pelvic floor R                          PT Long Term Goals - 10/15/19 0914      PT LONG TERM GOAL #1   Title Pt will report no leakage with every trip to trip to the toilet for urination in order perform activities in the community    Time 8    Period Weeks    Status New      PT LONG TERM GOAL #2   Title Pt will experience decreased pelvic pain from 2-3/10 to < 1 /10 in order to improve QOL    Time 6    Period Weeks    Status New      PT LONG TERM GOAL #3   Title Pt will improve her FOTO score Urinary 43 pts to > 48 pts in order to improve QOL    Time 10    Period Weeks    Status New      PT LONG  TERM GOAL #4   Title Pt will report no  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 Pt demo'd decreased C-section scar restrictions and R posterior anterior/posterior pelvic floor mm Post Tx today. Plan to perform internal pelvic floor assessment at next session. Pt required cues for deep core strengthening exercise. Pt continues to benefit from skilled PT   Examination-Activity Limitations Stand;Toileting    Stability/Clinical Decision Making Evolving/Moderate complexity    Rehab Potential Good    PT Frequency 1x / week    PT Duration Other (comment)   10   PT Treatment/Interventions Neuromuscular re-education;Scar mobilization;Functional mobility training;Gait training;Therapeutic activities;Therapeutic exercise;Patient/family education;Moist Heat;Manual techniques;Taping;Balance training;Cryotherapy     Consulted and Agree with Plan of Care Patient           Patient will benefit from skilled therapeutic intervention in order to improve the following deficits and impairments:  Hypomobility, Decreased scar mobility, Decreased mobility, Decreased coordination, Decreased range of motion, Decreased endurance, Decreased activity tolerance, Improper body mechanics, Increased fascial restricitons, Postural dysfunction, Pain, Difficulty walking, Increased edema, Decreased balance  Visit Diagnosis: Other lack of coordination  Muscle weakness (generalized)  Other abnormalities of gait and mobility     Problem List Patient Active Problem List   Diagnosis Date Noted  . 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 Essential hypertension 03/14/2019    [name] ,PT, DPT, E-RYT  11/04/2019, 3:46 PM  [place] [facility] MAIN [facility] SERVICES [address] [place], [place], [address] Phone: [phone]   Fax:  [phone]  Name: Melanie Lester MRN: [id number] Date of Birth: [birth year]

## 2019-11-18 ENCOUNTER — Ambulatory Visit: Payer: 59 | Attending: Family Medicine | Admitting: Physical Therapy

## 2019-11-18 ENCOUNTER — Other Ambulatory Visit: Payer: Self-pay

## 2019-11-18 DIAGNOSIS — R2689 Other abnormalities of gait and mobility: Secondary | ICD-10-CM | POA: Insufficient documentation

## 2019-11-18 DIAGNOSIS — M6281 Muscle weakness (generalized): Secondary | ICD-10-CM | POA: Insufficient documentation

## 2019-11-18 DIAGNOSIS — R278 Other lack of coordination: Secondary | ICD-10-CM | POA: Diagnosis present

## 2019-11-18 NOTE — Patient Instructions (Signed)
Stretch for pelvic floor   V- slides  "v heels slide away and then back toward buttocks and then rock knee to slight ,  slide heel along at 11 o clock away from buttocks   10 reps   ___    

## 2019-11-19 NOTE — Therapy (Signed)
[place] MAIN [facility] SERVICES [address] [place], [place], [address] Phone: [phone]   Fax:  [phone]  Physical Therapy Treatment  Patient Details  Name: Melanie Lester MRN: [id number] Date of Birth: 22-Feb-1971 Referring Provider (PT): Hendricks Milo, FNP   Encounter Date: 11/18/2019   PT End of Session - 11/18/19 1410    Visit Number 5    Number of Visits 10    Date for PT Re-Evaluation 12/16/19    PT Start Time 6962    PT Stop Time 1500    PT Time Calculation (min) 55 min           Past Medical History:  Diagnosis Date  . COVID-24 Jul 2019   . Hypertension     Past Surgical History:  Procedure Laterality Date  . CESAREAN SECTION    . CHOLECYSTECTOMY    . VAGINAL HYSTERECTOMY      There were no vitals filed for this visit.   Subjective Assessment - 11/18/19 1408    Subjective Pt reported 3 leakage accidents last week. Pt noticed improvements with her sex drive, incontinence. Pt is able to hold it to get tot he bathroom in time.  She is feeling alot better and not as ashamed with the past problems.    Pertinent History vaginal hysterectomy, C-section, Cholectectomy,                          Pelvic Floor Special Questions - 11/19/19 1451    Pelvic Floor Internal Exam pt consented verbally without contraindications     Exam Type Vaginal    Palpation dyscoordination of pelvic floor with breathholding, L anterior / R posterior mm tightness               [facility] Adult PT Treatment/Exercise - 11/19/19 1456      Neuro Re-ed    Neuro Re-ed Details  cued for proper deep core with optimize diaphragmatic breathing, educated on wearing bra extender , pelvic floor stretches       Modalities   Modalities Moist Heat      Moist Heat Therapy   Moist Heat Location --   abdominal      Manual Therapy   Internal Pelvic Floor STM/MWM at L anterior, R posterior mm releases                         PT Long Term Goals - 11/18/19 1411      PT LONG TERM GOAL #1   Title Pt will report no leakage with every trip to the toilet for urination in order perform activities in the community    Time 8    Period Weeks    Status Achieved      PT LONG TERM GOAL #2   Title Pt will experience decreased pelvic pain from 2-3/10 to < 1 /10 in order to improve QOL  ( 9/13: 1.5/10 )    Time 6    Period Weeks    Status Partially Met      PT LONG TERM GOAL #3   Title Pt will improve her FOTO score Urinary 43 pts to > 48 pts in order to improve QOL    Time 10    Period Weeks    Status On-going      PT LONG TERM GOAL #4   Title Pt will report no  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    Time 4    Period Weeks    Status Achieved      PT LONG TERM GOAL #5   Title Pt will demo increased SLS B from 1-2 sec to > 10 sec and increased R hip ext from 2/5 to > 4/5 , hip flexion 3/5 to > 4/5 in order to improve balance and minimze risk for falls ( 9/13: 12 sec B SLS,  hip ext/ flex 5/5 )    Time 10    Period Weeks    Status Achieved                 Plan - 11/18/19 1425    Clinical Impression Statement Progress noted through pt report of decreased incontinence and being able to make it to the bathroom in time before leakage, improved daily bowel mvoements without straining, improved sex drive , decreased pelvic pain from 2-3/10 to 1.5 /10.  Pt feels " more female" and "less ashamed" about her problems.   Pt showed decreased pelvic floor mm tightness through internal manual Tx today. Pt required excessive cues for optimal lengthening of pelvic floor and deep core coordination. Pt will continue to require more skilled PT. Plan to reassess pelvic floor to help pt achieve more coordinated contraction and lengthening of pelvic floor mm and minimize straining.       Examination-Activity Limitations Stand;Toileting    Stability/Clinical Decision  Making Evolving/Moderate complexity    Rehab Potential Good    PT Frequency 1x / week    PT Duration Other (comment)   10   PT Treatment/Interventions Neuromuscular re-education;Scar mobilization;Functional mobility training;Gait training;Therapeutic activities;Therapeutic exercise;Patient/family education;Moist Heat;Manual techniques;Taping;Balance training;Cryotherapy    Consulted and Agree with Plan of Care Patient           Patient will benefit from skilled therapeutic intervention in order to improve the following deficits and impairments:  Hypomobility, Decreased scar mobility, Decreased mobility, Decreased coordination, Decreased range of motion, Decreased endurance, Decreased activity tolerance, Improper body mechanics, Increased fascial restricitons, Postural dysfunction, Pain, Difficulty walking, Increased edema, Decreased balance  Visit Diagnosis: Other lack of coordination  Muscle weakness (generalized)  Other abnormalities of gait and mobility     Problem List Patient Active Problem List   Diagnosis Date Noted  . 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 Essential hypertension 03/14/2019    Jerl Mina ,PT, DPT, E-RYT  11/19/2019, 2:57 PM  [place] MAIN [facility] SERVICES [address] [place], [place], [address] Phone: [phone]   Fax:  [phone]  Name: INAS AVENA MRN: [id number] Date of Birth: 1970/07/22

## 2019-11-25 ENCOUNTER — Ambulatory Visit: Payer: 59 | Admitting: Physical Therapy

## 2019-12-02 ENCOUNTER — Ambulatory Visit: Payer: 59 | Admitting: Physical Therapy

## 2019-12-09 ENCOUNTER — Other Ambulatory Visit: Payer: Self-pay

## 2019-12-09 ENCOUNTER — Ambulatory Visit: Payer: 59 | Attending: Family Medicine | Admitting: Physical Therapy

## 2019-12-09 DIAGNOSIS — M6281 Muscle weakness (generalized): Secondary | ICD-10-CM | POA: Diagnosis present

## 2019-12-09 DIAGNOSIS — R2689 Other abnormalities of gait and mobility: Secondary | ICD-10-CM | POA: Diagnosis present

## 2019-12-09 DIAGNOSIS — R278 Other lack of coordination: Secondary | ICD-10-CM | POA: Diagnosis present

## 2019-12-09 DIAGNOSIS — M533 Sacrococcygeal disorders, not elsewhere classified: Secondary | ICD-10-CM | POA: Diagnosis not present

## 2019-12-09 NOTE — Therapy (Signed)
[place] MAIN [facility] SERVICES [address] [place], [place], [address] Phone: [phone]   Fax:  [phone]  Physical Therapy Treatment  Patient Details  Name: Melanie Lester MRN: [id number] Date of Birth: 01-14-1971 Referring Provider (PT): Hendricks Milo, FNP   Encounter Date: 12/09/2019   PT End of Session - 12/09/19 1418    Visit Number 6    Number of Visits 10    Date for PT Re-Evaluation 12/16/19    PT Start Time 8563    PT Stop Time 1500    PT Time Calculation (min) 55 min           Past Medical History:  Diagnosis Date  . COVID-24 Jul 2019   . Hypertension     Past Surgical History:  Procedure Laterality Date  . CESAREAN SECTION    . CHOLECYSTECTOMY    . VAGINAL HYSTERECTOMY      There were no vitals filed for this visit.   Subjective Assessment - 12/09/19 1644    Subjective Pt had carbon monoxide poisoning and was hospitalized. She is feelign better this week. Pt has had some leakage    Pertinent History vaginal hysterectomy, C-section, Cholectectomy,              [facility] PT Assessment - 12/09/19 1646      Observation/Other Assessments   Scoliosis --      Coordination   Gross Motor Movements are Fluid and Coordinated --      AROM   Overall AROM Comments --      Palpation   Palpation comment hypomobile at lateral R ankle, medial knee                       Pelvic Floor Special Questions - 12/09/19 1646    External Perineal Exam ischiocavernosus/ bulbospongiosus R tightness     Pelvic Floor Internal Exam pt consented verbally without contraindications     Exam Type Vaginal    Palpation tightness and tenderness at 11 o clock ( slight tensions piriformis R)              [facility] Adult PT Treatment/Exercise - 12/09/19 1645      Neuro Re-ed    Neuro Re-ed Details  cued for closed kinetic chain propioception to optimize DF/EV R        Manual Therapy   Manual therapy comments STM/MWM  at ischiocavernosus/ bulbospongiosus R .  medial knee/ R lateral ankle     Internal Pelvic Floor STM/MWM at 11 clock internally                        PT Long Term Goals - 12/09/19 1419      PT LONG TERM GOAL #1   Title Pt will report no leakage with every trip to the toilet for urination in order perform activities in the community    Time 8    Period Weeks    Status Achieved      PT LONG TERM GOAL #2   Title Pt will experience decreased pelvic pain from 2-3/10 to < 1 /10 in order to improve QOL  ( 9/13: 1.5/10 )    Time 6    Period Weeks    Status Partially Met      PT LONG TERM GOAL #3   Title Pt will improve her FOTO score Urinary 43 pts to > 48 pts in  order to improve QOL    Time 10    Period Weeks    Status On-going      PT LONG TERM GOAL #4   Title Pt will report no  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 Pt's R anterior mm were restricted and tender along with hypomobility at R medial knee and R lateral ankle. Pt reported a tendency to stand on her R LE more since she had L knee surgery. Pt required manual Tx along these areas and was advanced to propioception training to optimize knee alignment and minimize overactivity of pelvic floor on R. Pt has started working with a fitness traierr and thus, plan to train pt on technique to optimize her workout with less overactivity of pelvic floor.   Pt continues to benefit from skilled PT     Examination-Activity Limitations Stand;Toileting    Stability/Clinical Decision Making Evolving/Moderate complexity    Rehab Potential Good    PT Frequency 1x / week    PT Duration Other (comment)   10   PT Treatment/Interventions Neuromuscular re-education;Scar mobilization;Functional mobility training;Gait training;Therapeutic activities;Therapeutic exercise;Patient/family education;Moist Heat;Manual techniques;Taping;Balance training;Cryotherapy    Consulted and Agree with Plan of Care Patient           Patient will benefit from skilled therapeutic intervention in order to improve the following deficits and impairments:  Hypomobility, Decreased scar mobility, Decreased mobility, Decreased coordination, Decreased range of motion, Decreased endurance, Decreased activity tolerance, Improper body mechanics, Increased fascial restricitons, Postural dysfunction, Pain, Difficulty walking, Increased edema, Decreased balance  Visit Diagnosis: Other lack of coordination  Muscle weakness (generalized)  Other abnormalities of gait and mobility     Problem List Patient Active Problem List   Diagnosis Date Noted  . 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 Essential hypertension 03/14/2019    Jerl Mina ,PT, DPT, E-RYT  12/09/2019, 5:08 PM  [place] MAIN [facility] SERVICES [address] [place], [place], [address] Phone: [phone]   Fax:  [phone]  Name: Melanie Lester MRN: [id number] Date of Birth: [birth year]

## 2019-12-09 NOTE — Patient Instructions (Signed)
R pelvic floor stretch with strap and knee towards armpit   __     Letter T, seeasaw on one leg with band band under L foot, wrap by big toe then, outer knee/ thigh, L hand pulling ( elbow by side)  Plant ballmound, toes spread, thigh out against band,, tracking knee in line with 2-3rd toe line Dipping forward, R foot lifts slight ( whole body like a see saw) off floor or  Press back foot against wall 20 reps R only

## 2019-12-16 ENCOUNTER — Ambulatory Visit: Payer: 59 | Admitting: Physical Therapy

## 2019-12-16 ENCOUNTER — Other Ambulatory Visit: Payer: Self-pay

## 2019-12-16 DIAGNOSIS — R278 Other lack of coordination: Secondary | ICD-10-CM | POA: Diagnosis not present

## 2019-12-16 DIAGNOSIS — R2689 Other abnormalities of gait and mobility: Secondary | ICD-10-CM

## 2019-12-16 DIAGNOSIS — M6281 Muscle weakness (generalized): Secondary | ICD-10-CM

## 2019-12-16 NOTE — Patient Instructions (Signed)
Pulling low abdomen with knee rocking back and forth to stretch scars

## 2019-12-16 NOTE — Therapy (Signed)
[place] MAIN [facility] SERVICES [address] [place], [place], [address] Phone: [phone]   Fax:  [phone]  Physical Therapy Treatment  Patient Details  Name: Melanie Lester MRN: [id number] Date of Birth: 07/04/1970 Referring Provider (PT): Hendricks Milo, FNP   Encounter Date: 12/16/2019    Past Medical History:  Diagnosis Date  . COVID-24 Jul 2019   . Hypertension     Past Surgical History:  Procedure Laterality Date  . CESAREAN SECTION    . CHOLECYSTECTOMY    . VAGINAL HYSTERECTOMY      There were no vitals filed for this visit.   Subjective Assessment - 12/16/19 1408    Subjective Pt has no issues with the new exercises.    Pertinent History vaginal hysterectomy, C-section, Cholectectomy,                          Pelvic Floor Special Questions - 12/16/19 1439    Pelvic Floor Internal Exam pt consented verbally without contraindications     Exam Type Vaginal    Palpation tightness and tenderness at puborectalis and above pubic symphysis by C sections scar  ( 11 o'clock as the area of tenderness/ tightness that she would experienced during intercourse)     abdominal scar restrictions            [facility] Adult PT Treatment/Exercise - 12/16/19 1441      Therapeutic Activites    Other Therapeutic Activities self massage with lower trunk rotation      Moist Heat Therapy   Number Minutes Moist Heat 5 Minutes    Moist Heat Location --   perineum during new HEP     Manual Therapy   Manual therapy comments abdominal scar releases     Internal Pelvic Floor STM/MWM at 11 clock internally                        PT Long Term Goals - 12/09/19 1419      PT LONG TERM GOAL #1   Title Pt will report no leakage with every trip to the toilet for urination in order perform activities in the community    Time 8    Period Weeks    Status Achieved      PT LONG TERM GOAL #2   Title Pt will  experience decreased pelvic pain from 2-3/10 to < 1 /10 in order to improve QOL  ( 9/13: 1.5/10 )    Time 6    Period Weeks    Status Partially Met      PT LONG TERM GOAL #3   Title Pt will improve her FOTO score Urinary 43 pts to > 48 pts in order to improve QOL    Time 10    Period Weeks    Status On-going      PT LONG TERM GOAL #4   Title Pt will report no  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 Pt demo'd increased pelvic floor mm lengthening post Tx. Pt was instructed on self massage with mobilization.  Pt continues to benefit from skilled PT.     Examination-Activity Limitations Stand;Toileting    Stability/Clinical Decision Making Evolving/Moderate complexity    Rehab Potential Good    PT Frequency 1x / week    PT Duration Other (comment)   10   PT Treatment/Interventions Neuromuscular re-education;Scar mobilization;Functional mobility training;Gait training;Therapeutic activities;Therapeutic exercise;Patient/family education;Moist Heat;Manual techniques;Taping;Balance training;Cryotherapy    Consulted and Agree with Plan of Care Patient           Patient will benefit from skilled therapeutic intervention in order to improve the following deficits and impairments:  Hypomobility, Decreased scar mobility, Decreased mobility, Decreased coordination, Decreased range of motion, Decreased endurance, Decreased activity tolerance, Improper  body mechanics, Increased fascial restricitons, Postural dysfunction, Pain, Difficulty walking, Increased edema, Decreased balance  Visit Diagnosis: Other lack of coordination  Muscle weakness (generalized)  Other abnormalities of gait and mobility     Problem List Patient Active Problem List   Diagnosis Date Noted  . 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 Essential hypertension 03/14/2019    Jerl Mina ,PT, DPT, E-RYT  12/16/2019, 2:43 PM  [place] MAIN [facility] SERVICES [address] [place], [place], [address] Phone: [phone]   Fax:  [phone]  Name: Melanie Lester MRN: [id number] Date of Birth: [birth year]

## 2019-12-23 ENCOUNTER — Ambulatory Visit: Payer: 59 | Admitting: Physical Therapy

## 2019-12-30 ENCOUNTER — Ambulatory Visit: Payer: 59 | Admitting: Physical Therapy

## 2020-01-06 ENCOUNTER — Ambulatory Visit: Payer: 59 | Admitting: Physical Therapy

## 2020-01-13 ENCOUNTER — Ambulatory Visit: Payer: 59 | Attending: Family Medicine | Admitting: Physical Therapy

## 2020-01-13 DIAGNOSIS — M6281 Muscle weakness (generalized): Secondary | ICD-10-CM | POA: Insufficient documentation

## 2020-01-13 DIAGNOSIS — R278 Other lack of coordination: Secondary | ICD-10-CM | POA: Insufficient documentation

## 2020-01-13 DIAGNOSIS — R2689 Other abnormalities of gait and mobility: Secondary | ICD-10-CM | POA: Insufficient documentation

## 2020-01-20 ENCOUNTER — Ambulatory Visit: Payer: 59 | Admitting: Physical Therapy

## 2020-01-27 ENCOUNTER — Other Ambulatory Visit: Payer: Self-pay

## 2020-01-27 ENCOUNTER — Ambulatory Visit: Payer: 59 | Admitting: Physical Therapy

## 2020-01-27 DIAGNOSIS — R278 Other lack of coordination: Secondary | ICD-10-CM | POA: Diagnosis present

## 2020-01-27 DIAGNOSIS — R2689 Other abnormalities of gait and mobility: Secondary | ICD-10-CM

## 2020-01-27 DIAGNOSIS — M6281 Muscle weakness (generalized): Secondary | ICD-10-CM

## 2020-01-27 NOTE — Patient Instructions (Addendum)
Minisquat: Scoot buttocks back slight, hinge like you are looking at your reflection on a pond  Knees behind toes,  Inhale to "smell flowers"  Exhale on the rise "like rocket"  Do not lock knees, have more weight across ballmounds of feet, toes relaxed   10 reps x 3 x day  __   Focus on feet to relax pelvic floor during deep core exercises

## 2020-01-27 NOTE — Therapy (Signed)
[place] MAIN [facility] SERVICES [address] [place], [place], [address] Phone: [phone]   Fax:  [phone]  Physical Therapy Treatment / Progress Note   Patient Details  Name: SHAMANDA LEN MRN: [id number] Date of Birth: 02-11-1971 Referring Provider (PT): Hendricks Milo, FNP   Encounter Date: 01/27/2020   PT End of Session - 01/27/20 1458    Visit Number 8    Date for PT Re-Evaluation 04/06/20    PT Start Time 1418    PT Stop Time 1507    PT Time Calculation (min) 49 min    Activity Tolerance Patient tolerated treatment well    Behavior During Therapy [facility] for tasks assessed/performed           Past Medical History:  Diagnosis Date  . COVID-24 Jul 2019   . Hypertension     Past Surgical History:  Procedure Laterality Date  . CESAREAN SECTION    . CHOLECYSTECTOMY    . VAGINAL HYSTERECTOMY      There were no vitals filed for this visit.   Subjective Assessment - 01/27/20 1423    Subjective Pt reported she noticed leakage with leg lifts during her fitness training session. Pt has had no leakage otherwise.    Pertinent History vaginal hysterectomy, C-section, Cholectectomy,              [facility] PT Assessment - 01/27/20 1736      Squat   Comments poor alignment and technique                       Pelvic Floor Special Questions - 01/27/20 1436    Pelvic Floor Internal Exam pt consented verbally without contraindications     Exam Type Vaginal    Palpation tightness/ tenderness at 2-3 o clock, moderate at 11 oclock     Strength --   proper lengthening             [facility] Adult PT Treatment/Exercise - 01/27/20 1458      Neuro Re-ed    Neuro Re-ed Details  cued for feet propioception in deep core to relax pelvic floor , proper technique for mini squat       Moist Heat Therapy   Number Minutes Moist Heat 5 Minutes    Moist Heat Location --   perineum      Manual Therapy   Internal Pelvic Floor  STM/MWM at 11, 2-3 o clock internally                        PT Long Term Goals - 01/27/20 1434      PT LONG TERM GOAL #1   Title Pt will report no leakage with every trip to the toilet for urination in order perform activities in the community    Time 8    Period Weeks    Status Achieved      PT LONG TERM GOAL #2   Title Pt will experience decreased pelvic pain from 2-3/10 to < 1 /10 in order to improve QOL  ( 9/13: 1.5/10     11/22:  1-3/10   )    Time 6    Period Weeks    Status Partially Met      PT LONG TERM GOAL #3   Title Pt will improve her FOTO score Urinary 43 pts to > 48 pts in order to improve QOL  Time 10    Period Weeks    Status On-going      PT LONG TERM GOAL #4   Title Pt will report no  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    Time 4    Period Weeks    Status Achieved      PT LONG TERM GOAL #5   Title Pt will demo increased SLS B from 1-2 sec to > 10 sec and increased R hip ext from 2/5 to > 4/5 , hip flexion 3/5 to > 4/5 in order to improve balance and minimze risk for falls ( 9/13: 12 sec B SLS,  hip ext/ flex 5/5 )    Time 10    Period Weeks    Status Achieved      Additional Long Term Goals   Additional Long Term Goals Yes      PT LONG TERM GOAL #6   Title Pt will be IND with proper technique in fitness routine and machines and yoga practice in order to minimize injuries and relapse Sx    Time 10    Period Weeks    Status New    Target Date 04/06/20                 Plan - 01/27/20 1735    Clinical Impression Statement Pt has achieved 3/6 goals and progressing well towards  remaining well. Pt reports less urinary leakage when bending at work. Pt demo'd significantly improved spinal and pelvic alignment/ mobility, improved posture, decreased pelvic floor mm tightness, and improved deep core coordination and strength. Pt continues to require manual Tx to further minimize pelvic floor mm tightness  and will benefit from modifications to fitness routine to help pt return to gym setting with less risk for injuries and relapse of Sx of leakage and pelvic pain. Pt continues to benefit from skilled PT     Examination-Activity Limitations Stand;Toileting    Stability/Clinical Decision Making Evolving/Moderate complexity    Rehab Potential Good    PT Frequency 1x / week    PT Duration Other (comment)   10   PT Treatment/Interventions Neuromuscular re-education;Scar mobilization;Functional mobility training;Gait training;Therapeutic activities;Therapeutic exercise;Patient/family education;Moist Heat;Manual techniques;Taping;Balance training;Cryotherapy    Consulted and Agree with Plan of Care Patient           Patient will benefit from skilled therapeutic intervention in order to improve the following deficits and impairments:  Hypomobility, Decreased scar mobility, Decreased mobility, Decreased coordination, Decreased range of motion, Decreased endurance, Decreased activity tolerance, Improper body mechanics, Increased fascial restricitons, Postural dysfunction, Pain, Difficulty walking, Increased edema, Decreased balance  Visit Diagnosis: Other lack of coordination  Muscle weakness (generalized)  Other abnormalities of gait and mobility     Problem List Patient Active Problem List   Diagnosis Date Noted  . 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 Essential hypertension 03/14/2019    Jerl Mina ,PT, DPT, E-RYT  01/27/2020, 5:37 PM  [place] MAIN [facility] SERVICES [address] [place], [place], [address] Phone: [phone]   Fax:  [phone]  Name: MERILYN PAGAN MRN: [id number] Date of Birth: [birth year]

## 2020-02-06 ENCOUNTER — Ambulatory Visit: Payer: 59 | Attending: Family Medicine | Admitting: Physical Therapy

## 2020-02-06 ENCOUNTER — Other Ambulatory Visit: Payer: Self-pay

## 2020-02-06 DIAGNOSIS — R2689 Other abnormalities of gait and mobility: Secondary | ICD-10-CM | POA: Insufficient documentation

## 2020-02-06 DIAGNOSIS — M6281 Muscle weakness (generalized): Secondary | ICD-10-CM | POA: Diagnosis present

## 2020-02-06 DIAGNOSIS — R278 Other lack of coordination: Secondary | ICD-10-CM | POA: Insufficient documentation

## 2020-02-06 NOTE — Patient Instructions (Signed)
Hamstring w strap on back- 2 ways  Hamstring seated with strap  ---  Lunge position with calf stretch   Wall stretch for calf   __  Lunge principles  Feet are hip width apart, R foot one behind like you are on ski tracks,  L knee bent over ankle but not more forward then the ankle.  Make sure 50% weight is in the front foot/leg , 50% weight is the back foot/ leg    __  Avoid flip flops

## 2020-02-06 NOTE — Therapy (Signed)
[place] MAIN [facility] SERVICES [address] [place], [place], [address] Phone: [phone]   Fax:  [phone]  Physical Therapy Treatment  Patient Details  Name: Melanie Lester MRN: [id number] Date of Birth: [birth year] Referring Provider (PT): Hendricks Milo, FNP   Encounter Date: 02/06/2020   PT End of Session - 02/06/20 1411    Visit Number 9    Date for PT Re-Evaluation 04/06/20    PT Start Time 1410    PT Stop Time 1500    PT Time Calculation (min) 50 min    Activity Tolerance Patient tolerated treatment well    Behavior During Therapy [facility] for tasks assessed/performed           Past Medical History:  Diagnosis Date  . COVID-24 Jul 2019   . Hypertension     Past Surgical History:  Procedure Laterality Date  . CESAREAN SECTION    . CHOLECYSTECTOMY    . VAGINAL HYSTERECTOMY      There were no vitals filed for this visit.   Subjective Assessment - 02/06/20 1410    Subjective Pt had a gentle yoga class with her daughter 2 x last week and felt ok because they were gentle classes    Pertinent History vaginal hysterectomy, C-section, Cholectectomy,              [facility] PT Assessment - 02/06/20 1559      Lunges   Comments limited DF , alignment poor       Other:   Other/ Comments bridge with poor technique and alignment , glut overuse      Palpation   Palpation comment increased hypomobility at L foot fore/ hind foot                          [facility] Adult PT Treatment/Exercise - 02/06/20 0001      Manual Therapy   Internal Pelvic Floor B distraction at talocrural joints, AP/PA along rays to promote balance between supination/ pronation, STM at transverse head of hallus adductor mm, along pereoneal longus, AP/ superior mob at fibula head to promote mobility at first and midfoot                        PT Long Term Goals - 01/27/20 1434      PT LONG TERM GOAL #1   Title Pt will report  no leakage with every trip to the toilet for urination in order perform activities in the community    Time 8    Period Weeks    Status Achieved      PT LONG TERM GOAL #2   Title Pt will experience decreased pelvic pain from 2-3/10 to < 1 /10 in order to improve QOL  ( 9/13: 1.5/10     11/22:  1-3/10   )    Time 6    Period Weeks    Status Partially Met      PT LONG TERM GOAL #3   Title Pt will improve her FOTO score Urinary 43 pts to > 48 pts in order to improve QOL    Time 10    Period Weeks    Status On-going      PT LONG TERM GOAL #4   Title Pt will report no  leakage  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    Time 4  Period Weeks    Status Achieved      PT LONG TERM GOAL #5   Title Pt will demo increased SLS B from 1-2 sec to > 10 sec and increased R hip ext from 2/5 to > 4/5 , hip flexion 3/5 to > 4/5 in order to improve balance and minimze risk for falls ( 9/13: 12 sec B SLS,  hip ext/ flex 5/5 )    Time 10    Period Weeks    Status Achieved      Additional Long Term Goals   Additional Long Term Goals Yes      PT LONG TERM GOAL #6   Title Pt will be IND with proper technique in fitness routine and machines and yoga practice in order to minimize injuries and relapse Sx    Time 10    Period Weeks    Status New    Target Date 04/06/20                 Plan - 02/06/20 1538    Clinical Impression Statement Progressed pt to fitness type execises with instruction on alignment and orm . Pt required cues for less glut overuse and propioception with bridge exercise. Pt requried hamstring and calf stretches due to tightness. Manual Tx helped to improve L ankle DF which helped with lunges and calf stretch. Pt continues to benefit from skilled PT    Examination-Activity Limitations Stand;Toileting    Stability/Clinical Decision Making Evolving/Moderate complexity    Rehab Potential Good    PT Frequency 1x / week    PT Duration Other (comment)    10   PT Treatment/Interventions Neuromuscular re-education;Scar mobilization;Functional mobility training;Gait training;Therapeutic activities;Therapeutic exercise;Patient/family education;Moist Heat;Manual techniques;Taping;Balance training;Cryotherapy    Consulted and Agree with Plan of Care Patient           Patient will benefit from skilled therapeutic intervention in order to improve the following deficits and impairments:  Hypomobility, Decreased scar mobility, Decreased mobility, Decreased coordination, Decreased range of motion, Decreased endurance, Decreased activity tolerance, Improper body mechanics, Increased fascial restricitons, Postural dysfunction, Pain, Difficulty walking, Increased edema, Decreased balance  Visit Diagnosis: Other lack of coordination  Muscle weakness (generalized)  Other abnormalities of gait and mobility     Problem List Patient Active Problem List   Diagnosis Date Noted  . 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 Essential hypertension 03/14/2019    Jerl Mina 02/06/2020, 4:01 PM  [place] MAIN [facility] SERVICES [address] [place], [place], [address] Phone: [phone]   Fax:  [phone]  Name: Melanie Lester MRN: [id number] Date of Birth: 05-20-1970

## 2020-02-17 ENCOUNTER — Ambulatory Visit: Payer: 59 | Admitting: Physical Therapy

## 2020-02-17 ENCOUNTER — Other Ambulatory Visit: Payer: Self-pay

## 2020-02-17 DIAGNOSIS — R2689 Other abnormalities of gait and mobility: Secondary | ICD-10-CM

## 2020-02-17 DIAGNOSIS — M6281 Muscle weakness (generalized): Secondary | ICD-10-CM

## 2020-02-17 DIAGNOSIS — R278 Other lack of coordination: Secondary | ICD-10-CM | POA: Diagnosis not present

## 2020-02-17 NOTE — Therapy (Addendum)
[facility] MAIN [facility] SERVICES [address] [place], [place], [address] Phone: [phone]   Fax:  [phone]  Physical Therapy Treatment / Progress note reporting from 10/07/19 to 02/17/20  Patient Details  Name: Melanie Lester MRN: [id number] Date of Birth: 10/22/1970 Referring Provider (PT): Lorenda Cahill, FNP   Encounter Date: 02/17/2020   PT End of Session - 02/17/20 1507    Visit Number 10    Date for PT Re-Evaluation 04/06/20    PT Start Time 1503    PT Stop Time 1600    PT Time Calculation (min) 57 min    Activity Tolerance Patient tolerated treatment well    Behavior During Therapy [facility] for tasks assessed/performed           Past Medical History:  Diagnosis Date  . COVID-24 Jul 2019   . Hypertension     Past Surgical History:  Procedure Laterality Date  . CESAREAN SECTION    . CHOLECYSTECTOMY    . VAGINAL HYSTERECTOMY      There were no vitals filed for this visit.   Subjective Assessment - 02/17/20 1516    Subjective Pt reported se is on her period which is lasting for over 2 weeks now and is the first time she has had a period in 3-4 years because she thought she was in menopause. Pt has a gynecology appt this week. Pt continues to notice less leakage whenbending and exercising. . Pt noticed her knee popped when performing lunge with fitness trainer.    Pertinent History vaginal hysterectomy, C-section, Cholectectomy,              [facility] PT Assessment - 02/17/20 1522      Other:   Other/ Comments poor eccentric control of step down      Strength   Overall Strength Comments hip abd 4-/5 B                         [facility] Adult PT Treatment/Exercise - 02/17/20 1616      Therapeutic Activites    Other Therapeutic Activities administered FOTO, explained principles to alignment in fitness exercises  to minimize injuries      Neuro Re-ed    Neuro Re-ed Details  cued for proper knee / heel  alignment in lunges , eccentric control in step down      Exercises   Exercises --   see pt instructions                      PT Long Term Goals - 02/17/20 1508      PT LONG TERM GOAL #1   Title Pt will report no leakage with every trip to the toilet for urination in order perform activities in the community    Time 8    Period Weeks    Status Achieved      PT LONG TERM GOAL #2   Title Pt will experience decreased pelvic pain from 2-3/10 to < 1 /10 in order to improve QOL  ( 9/13: 1.5/10     11/22:  1-3/10     12/13:  1/10    )    Time 6    Period Weeks    Status Achieved      PT LONG TERM GOAL #3   Title Pt will improve her FOTO score Urinary 43 pts to > 48 pts in order to improve  QOL  ( 12/13: 52 pts)    Time 10    Period Weeks    Status Achieved      PT LONG TERM GOAL #4   Title Pt will report no  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    Time 4    Period Weeks    Status Achieved      PT LONG TERM GOAL #5   Title Pt will demo increased SLS B from 1-2 sec to > 10 sec and increased R hip ext from 2/5 to > 4/5 , hip flexion 3/5 to > 4/5 in order to improve balance and minimze risk for falls ( 9/13: 12 sec B SLS,  hip ext/ flex 5/5  ,   12/13:  31 sec R, 30 sec L )    Time 10    Period Weeks    Status Achieved      PT LONG TERM GOAL #6   Title Pt will be IND with proper technique in fitness routine and machines and yoga practice in order to minimize injuries and relapse Sx    Time 10    Period Weeks    Status On-going                 Plan - 02/17/20 1518    Clinical Impression Statement Pt is making improvements with pelvic functions. Focused today on lower kinetic chain to integrate into fitness exercises with less risk for overactivity of pelvic floor mm tightness.    Progressed pt to hip abduction strengthening to minimize genu valgus. Required cues to improve eccentric control with step downs and proper alignment  with lunges to minimize knee valgus. Pt is learning more propioception with today's new exercises. This will help minimize overactivity of pelvic floor and safely integrate pt with fitness training with less risk for injuries. Pt continues to have less leakage with bending and with exercising. Pt continues to benefit from skilled PT . Plan to communicate with pt and gynecologists re: her prolonged period that started and lasting for the past 2 weeks. This period is the first one in 3-4 years. Plan to modify POC if needed or if there are contraindications based on her medical work up.     Examination-Activity Limitations Stand;Toileting    Stability/Clinical Decision Making Evolving/Moderate complexity    Rehab Potential Good    PT Frequency 1x / week    PT Duration Other (comment)   10   PT Treatment/Interventions Neuromuscular re-education;Scar mobilization;Functional mobility training;Gait training;Therapeutic activities;Therapeutic exercise;Patient/family education;Moist Heat;Manual techniques;Taping;Balance training;Cryotherapy    Consulted and Agree with Plan of Care Patient           Patient will benefit from skilled therapeutic intervention in order to improve the following deficits and impairments:  Hypomobility,Decreased scar mobility,Decreased mobility,Decreased coordination,Decreased range of motion,Decreased endurance,Decreased activity tolerance,Improper body mechanics,Increased fascial restricitons,Postural dysfunction,Pain,Difficulty walking,Increased edema,Decreased balance  Visit Diagnosis: Other lack of coordination  Muscle weakness (generalized)  Other abnormalities of gait and mobility     Problem List Patient Active Problem List   Diagnosis Date Noted  . 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 Essential hypertension 03/14/2019    [name] ,PT, DPT, E-RYT  02/17/2020, 4:18 PM  [place] [facility] MAIN [facility] SERVICES [address] [place], [place], [address] Phone: [phone]   Fax:  [phone]  Name: Melanie Lester MRN: [id number] Date of Birth: 19-May-1970

## 2020-02-17 NOTE — Patient Instructions (Signed)
  _________  Step ups   Step up L up, R up, L down, R down, exhale up and down, lower heel slow and controlled, shoulder slightly forward, center of mass moves after ballmounds contacts  10 x 3 reps each   _________   3- foot tap  10 reps  Each side   Hold onto wall   Slightly bend of standing knee, and keep hips above foot   ballmound of opposite leg   taps to each direction and   back to spot under hips- notice equal pressure through both legs, and across ballmound and heels  ___  Lunges:  find goldilocks length , keep feet hip width apart   back foot, heel and toes need to be straight   to keep hips squared to the front   ___   Clam Shell 45 Degrees  Lying with hips and knees bent 45, one pillow between knees and ankles. Heel together, toes apart like ballerina,  Lift knee with exhale while pressing heels together. Be sure pelvis does not roll backward. Do not arch back. Do 20 times, each leg, 2 times per day.     Complimentary stretch: [employer] _ foot over _ thigh, opposite knee straight  3 breaths  * Keep pelvis levelled with tactile cue with hand under back of hips  * Slide the ankle of the supporting foot out to decrease the angle which can help level the pelvis   Cross thigh over thigh, exhale to hug the thighs in with arms pulling back of thigh, shoulders/ head is relaxed down , Use towel behind thigh is needed.  10 reps  Cross _ ankle overopposite thigh, opposite knee bent, foot on bed ( Figure-4)  exhale to hug the thighs in with arms pulling back of thigh, shoulders/ head is relaxed down , Use towel behind thigh is needed.  10 reps

## 2020-02-24 ENCOUNTER — Ambulatory Visit: Payer: 59 | Admitting: Physical Therapy

## 2020-03-02 ENCOUNTER — Ambulatory Visit: Payer: 59 | Admitting: Physical Therapy

## 2020-03-02 ENCOUNTER — Other Ambulatory Visit: Payer: Self-pay

## 2020-03-02 DIAGNOSIS — R278 Other lack of coordination: Secondary | ICD-10-CM | POA: Diagnosis not present

## 2020-03-02 DIAGNOSIS — M6281 Muscle weakness (generalized): Secondary | ICD-10-CM

## 2020-03-03 NOTE — Therapy (Signed)
[place] [facility] MAIN [facility] SERVICES [address] [place], [place], [address] Phone: [phone]   Fax:  [phone]  Physical Therapy Treatment  Patient Details  Name: Melanie Lester MRN: [id number] Date of Birth: 1970-12-31 Referring Provider (PT): Lorenda Cahill, FNP   Encounter Date: 03/02/2020   PT End of Session - 03/03/20 0855    Visit Number 11    Date for PT Re-Evaluation 04/06/20    PT Start Time 1504    PT Stop Time 1600    PT Time Calculation (min) 56 min    Activity Tolerance Patient tolerated treatment well    Behavior During Therapy [facility] for tasks assessed/performed           Past Medical History:  Diagnosis Date  . COVID-24 Jul 2019   . Hypertension     Past Surgical History:  Procedure Laterality Date  . CESAREAN SECTION    . CHOLECYSTECTOMY    . VAGINAL HYSTERECTOMY      There were no vitals filed for this visit.   Subjective Assessment - 03/03/20 0856    Subjective Pt has gained more confidence with her leakage problem.    Pertinent History vaginal hysterectomy, C-section, Cholectectomy,                          Pelvic Floor Special Questions - 03/03/20 1121    Pelvic Floor Internal Exam pt consented verbally without contraindications     Exam Type Vaginal    Palpation tightness  L 5-6 olcock scar, ATLA             [facility] Adult PT Treatment/Exercise - 03/03/20 0858      Therapeutic Activites    Other Therapeutic Activities explained sexual function of pelvic floor      Neuro Re-ed    Neuro Re-ed Details  cued for new stretches for lengthening pelvic floor      Modalities   Modalities Moist Heat      Moist Heat Therapy   Number Minutes Moist Heat 5 Minutes    Moist Heat Location --   perineum     Manual Therapy   Internal Pelvic Floor STM/MWM at L 5-6 olcock scar, ATLA                       PT Long Term Goals - 03/03/20 0900      PT LONG TERM GOAL #1    Title Pt will report no leakage with every trip to the toilet for urination in order perform activities in the community    Time 8    Period Weeks    Status Achieved      PT LONG TERM GOAL #2   Title Pt will experience decreased pelvic pain from 2-3/10 to < 1 /10 in order to improve QOL  ( 9/13: 1.5/10     11/22:  1-3/10     12/13:  1/10    )    Time 6    Period Weeks    Status Achieved      PT LONG TERM GOAL #3   Title Pt will improve her FOTO score Urinary 43 pts to > 48 pts in order to improve QOL  ( 12/13: 52 pts)    Time 10    Period Weeks    Status Achieved      PT LONG TERM GOAL #4   Title Pt  will report no  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    Time 4    Period Weeks    Status Achieved      PT LONG TERM GOAL #5   Title Pt will demo increased SLS B from 1-2 sec to > 10 sec and increased R hip ext from 2/5 to > 4/5 , hip flexion 3/5 to > 4/5 in order to improve balance and minimze risk for falls ( 9/13: 12 sec B SLS,  hip ext/ flex 5/5  ,   12/13:  31 sec R, 30 sec L )    Time 10    Period Weeks    Status Achieved      PT LONG TERM GOAL #6   Title Pt will be IND with proper technique in fitness routine and machines and yoga practice in order to minimize injuries and relapse Sx    Time 10    Period Weeks    Status On-going      PT LONG TERM GOAL #7   Title Pt will demo no tightness / scar restrictions at pelvic floor and demo proper lengthening of pelvic floor to restore all functions of pelvic floor    Time 8    Period Weeks    Status New    Target Date                 Plan - 03/03/20 0856    Clinical Impression Statement Pt required internal pelvic floor fascial releases to minimize L pelvic floor restrictions. Pt demo'd improved pelvic floor lengthening post Tx. Pt was explained proper movement of pelvic floor and positions that help to maintain lengthened phase of mm to minimize pain. Pt continues to benefit from skilled  PT    Examination-Activity Limitations Stand;Toileting    Stability/Clinical Decision Making Evolving/Moderate complexity    Rehab Potential Good    PT Frequency 1x / week    PT Duration Other (comment)   10   PT Treatment/Interventions Neuromuscular re-education;Scar mobilization;Functional mobility training;Gait training;Therapeutic activities;Therapeutic exercise;Patient/family education;Moist Heat;Manual techniques;Taping;Balance training;Cryotherapy    Consulted and Agree with Plan of Care Patient           Patient will benefit from skilled therapeutic intervention in order to improve the following deficits and impairments:  Hypomobility,Decreased scar mobility,Decreased mobility,Decreased coordination,Decreased range of motion,Decreased endurance,Decreased activity tolerance,Improper body mechanics,Increased fascial restricitons,Postural dysfunction,Pain,Difficulty walking,Increased edema,Decreased balance  Visit Diagnosis: Other lack of coordination  Muscle weakness (generalized)     Problem List Patient Active Problem List   Diagnosis Date Noted  . 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 Essential hypertension 03/14/2019    [name] ,PT, DPT, E-RYT  03/03/2020, 11:22 AM  [place] [facility] MAIN [facility] SERVICES [address] [place], [place], [address] Phone: [phone]   Fax:  [phone]  Name: Melanie Lester MRN: [id number] Date of Birth: [birth year]

## 2020-03-17 ENCOUNTER — Ambulatory Visit: Payer: 59 | Attending: Family Medicine | Admitting: Physical Therapy

## 2020-03-17 ENCOUNTER — Other Ambulatory Visit: Payer: Self-pay

## 2020-03-17 DIAGNOSIS — R2689 Other abnormalities of gait and mobility: Secondary | ICD-10-CM | POA: Diagnosis present

## 2020-03-17 DIAGNOSIS — R278 Other lack of coordination: Secondary | ICD-10-CM | POA: Diagnosis present

## 2020-03-17 DIAGNOSIS — M6281 Muscle weakness (generalized): Secondary | ICD-10-CM | POA: Diagnosis present

## 2020-03-17 DIAGNOSIS — M533 Sacrococcygeal disorders, not elsewhere classified: Secondary | ICD-10-CM | POA: Insufficient documentation

## 2020-03-17 NOTE — Therapy (Signed)
[facility] MAIN [facility] SERVICES [address] [place], [place], [address] Phone: [phone]   Fax:  [phone]  Physical Therapy Treatment  Patient Details  Name: Melanie Lester MRN: [id number] Date of Birth: 1970/08/24 Referring Provider (PT): Melanie Cahill, FNP   Encounter Date: 03/17/2020   PT End of Session - 03/17/20 0944    Visit Number 12    Date for PT Re-Evaluation 04/06/20    PT Start Time 0800    PT Stop Time 0915    PT Time Calculation (min) 75 min    Activity Tolerance Patient tolerated treatment well    Behavior During Therapy [name] for tasks assessed/performed           Past Medical History:  Diagnosis Date  . COVID-24 Jul 2019   . Hypertension     Past Surgical History:  Procedure Laterality Date  . CESAREAN SECTION    . CHOLECYSTECTOMY    . VAGINAL HYSTERECTOMY      There were no vitals filed for this visit.   Subjective Assessment - 03/17/20 0809    Subjective Pt reported feeling good after last session. Pt continues to do yoga despite not having a trainer. Pt feels good about having lost 6 pounds    Pertinent History vaginal hysterectomy, C-section, Cholectectomy,              [facility] PT Assessment - 03/17/20 0945      Other:   Other/Comments bridge: glut/ adductor overuse . pelvic tilt with glut overuse      Palpation   Palpation comment abdominal scar retrictions suprepubic area                      Pelvic Floor Special Questions - 03/17/20 0811    Pelvic Floor Internal Exam pt consented verbally without contraindications     Exam Type Vaginal    Palpation tightness at 5-7 clock 1-2 nd layer, finching with 7 clock, no tightness R ATLA             [facility] Adult PT Treatment/Exercise - 03/17/20 0955      Therapeutic Activites    Other Therapeutic Activities explained yoga postures for relaxation and pelvic floor      Neuro Re-ed    Neuro Re-ed Details  cued for new  stretches  for abdominal scar releases, principles of alignment and relaxation/ engagement of deep core in happy baby, childs pose      Modalities   Modalities Moist Heat      Moist Heat Therapy   Moist Heat Location --   perineum . low abdomen     Manual Therapy   Manual therapy comments abdominal scar fascial releases with MWM    Internal Pelvic Floor STM/MWM at probelm area noted,                       PT Long Term Goals - 03/03/20 0900      PT LONG TERM GOAL #1   Title Pt will report no leakage with every trip to the toilet for urination in order perform activities in the community    Time 8    Period Weeks    Status Achieved      PT LONG TERM GOAL #2   Title Pt will experience decreased pelvic pain from 2-3/10 to < 1 /10 in order to improve QOL  ( 9/13: 1.5/10  11/22:  1-3/10     12/13:  1/10    )    Time 6    Period Weeks    Status Achieved      PT LONG TERM GOAL #3   Title Pt will improve her FOTO score Urinary 43 pts to > 48 pts in order to improve QOL  ( 12/13: 52 pts)    Time 10    Period Weeks    Status Achieved      PT LONG TERM GOAL #4   Title Pt will report no  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    Time 4    Period Weeks    Status Achieved      PT LONG TERM GOAL #5   Title Pt will demo increased SLS B from 1-2 sec to > 10 sec and increased R hip ext from 2/5 to > 4/5 , hip flexion 3/5 to > 4/5 in order to improve balance and minimze risk for falls ( 9/13: 12 sec B SLS,  hip ext/ flex 5/5  ,   12/13:  31 sec R, 30 sec L )    Time 10    Period Weeks    Status Achieved      PT LONG TERM GOAL #6   Title Pt will be IND with proper technique in fitness routine and machines and yoga practice in order to minimize injuries and relapse Sx    Time 10    Period Weeks    Status On-going      PT LONG TERM GOAL #7   Title Pt will demo no tightness / scar restrictions at pelvic floor and demo proper lengthening of  pelvic floor to restore all functions of pelvic floor    Time 8    Period Weeks    Status New    Target Date 05/12/20                 Plan - 03/17/20 0944    Clinical Impression Statement Pt demonstrates decreasing pelvic floor tightness after manual Tx. Pt is making progress with more lengthening of pelvic floor. Provided yoga postures for functional activities and relaxation as pt is interested in incorporating yoga into her fitness routine.  Required minor cues for less lumbar lordosis which indicates improved deep core strength. Pt continues to benefit from skilled PT.      Examination-Activity Limitations Stand;Toileting    Stability/Clinical Decision Making Evolving/Moderate complexity    Rehab Potential Good    PT Frequency 1x / week    PT Duration Other (comment)   10   PT Treatment/Interventions Neuromuscular re-education;Scar mobilization;Functional mobility training;Gait training;Therapeutic activities;Therapeutic exercise;Patient/family education;Moist Heat;Manual techniques;Taping;Balance training;Cryotherapy    Consulted and Agree with Plan of Care Patient           Patient will benefit from skilled therapeutic intervention in order to improve the following deficits and impairments:  Hypomobility,Decreased scar mobility,Decreased mobility,Decreased coordination,Decreased range of motion,Decreased endurance,Decreased activity tolerance,Improper body mechanics,Increased fascial restricitons,Postural dysfunction,Pain,Difficulty walking,Increased edema,Decreased balance  Visit Diagnosis: Other lack of coordination  Muscle weakness (generalized)  Other abnormalities of gait and mobility     Problem List Patient Active Problem List   Diagnosis Date Noted  . 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 Essential hypertension 03/14/2019    Melanie Lester 03/17/2020, 10:01 AM  [name] MAIN [facility] SERVICES [address]  [place], [place], [address] Phone: [phone]  Fax:  [phone]  Name: Melanie Lester MRN: [id number] Date of Birth: 01-Jan-1971

## 2020-03-17 NOTE — Patient Instructions (Signed)
Abdominal massage upward from L,  R , center to belly button 3 stroke x 3 , pressure is gentle and light with all fingers flat, not using finger tips   __  Use legs up the wall practice for relaxation  Pillow under hips after bridging    Bridging technique:     Bridge:  Pressing letter A points in back body,  Heels back towards buttocks, shin perpendicular to ground Inhale, exhale lift buttocks without squeezing butt muscles.  10 reps    10 x 2 reps     Happy pose pose for relaxation   __   Marjo Bicker pose with toes tucked, pillow under belly for support      Use of sheet behind buttock for guiding and use of breath to control relaxation and contraction of pelvic floor

## 2020-03-30 ENCOUNTER — Ambulatory Visit: Payer: 59 | Admitting: Physical Therapy

## 2020-04-06 ENCOUNTER — Ambulatory Visit: Payer: 59 | Admitting: Physical Therapy

## 2020-04-06 ENCOUNTER — Other Ambulatory Visit: Payer: Self-pay

## 2020-04-06 DIAGNOSIS — R2689 Other abnormalities of gait and mobility: Secondary | ICD-10-CM

## 2020-04-06 DIAGNOSIS — R278 Other lack of coordination: Secondary | ICD-10-CM

## 2020-04-06 DIAGNOSIS — M6281 Muscle weakness (generalized): Secondary | ICD-10-CM

## 2020-04-06 NOTE — Therapy (Signed)
[facility] MAIN [facility] SERVICES [address] [place], [place], [address] Phone: [phone]   Fax:  [phone]  Physical Therapy Treatment  Patient Details  Name: Melanie Lester MRN: [id number] Date of Birth: 1970/10/05 Referring Provider (PT): Lorenda Cahill, FNP   Encounter Date: 04/06/2020   PT End of Session - 04/06/20 0813    Visit Number 13    Date for PT Re-Evaluation 06/15/20   PN 1/31   PT Start Time 0810    PT Stop Time 0900    PT Time Calculation (min) 50 min    Activity Tolerance Patient tolerated treatment well    Behavior During Therapy [facility] for tasks assessed/performed           Past Medical History:  Diagnosis Date  . COVID-24 Jul 2019   . Hypertension     Past Surgical History:  Procedure Laterality Date  . CESAREAN SECTION    . CHOLECYSTECTOMY    . VAGINAL HYSTERECTOMY      There were no vitals filed for this visit.   Subjective Assessment - 04/06/20 0812    Subjective Pt reported she is 75-80% better with leakage and able to make it to the bathroom in time before leakage    Pertinent History vaginal hysterectomy, C-section, Cholectectomy,              [facility] PT Assessment - 04/06/20 1004      Observation/Other Assessments   Observations shoulder IR in new HEP, able to perform cervical retraction/ scapular depression with minor cues      Other:   Other/Comments bridges with posterior WBing on heels                         [facility] Adult PT Treatment/Exercise - 04/06/20 1556      Neuro Re-ed    Neuro Re-ed Details  cued for proper mini squat, and new HEP for scapulothoracic strengthening      Exercises   Exercises --   see pt isntructions                      PT Long Term Goals - 04/06/20 0814      PT LONG TERM GOAL #1   Title Pt will report no leakage with every trip to the toilet for urination in order perform activities in the community    Time 8    Period  Weeks    Status Achieved      PT LONG TERM GOAL #2   Title Pt will experience decreased pelvic pain from 2-3/10 to < 1 /10 in order to improve QOL  ( 9/13: 1.5/10     11/22:  1-3/10     12/13:  1/10    )    Time 6    Period Weeks    Status Achieved      PT LONG TERM GOAL #3   Title Pt will improve her FOTO score Urinary 43 pts to > 48 pts in order to improve QOL  ( 12/13: 52 pts)    Time 10    Period Weeks    Status Achieved      PT LONG TERM GOAL #4   Title Pt will report no  leakage  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    Time 4    Period Weeks    Status Achieved  PT LONG TERM GOAL #5   Title Pt will demo increased SLS B from 1-2 sec to > 10 sec and increased R hip ext from 2/5 to > 4/5 , hip flexion 3/5 to > 4/5 in order to improve balance and minimze risk for falls ( 9/13: 12 sec B SLS,  hip ext/ flex 5/5  ,   12/13:  31 sec R, 30 sec L )    Time 10    Period Weeks    Status Achieved      PT LONG TERM GOAL #6   Title Pt will be IND with proper technique in fitness routine and machines and yoga practice in order to minimize injuries and relapse Sx    Time 10    Period Weeks    Status On-going      PT LONG TERM GOAL #7   Title Pt will demo no tightness / scar restrictions at pelvic floor and demo proper lengthening of pelvic floor to restore all functions of pelvic floor    Time 8    Period Weeks    Status New                 Plan - 04/06/20 7989    Clinical Impression Statement Pt achieved 7/9 goals and progressing well towards remaining goals.  Pt reported she is 75-80% better with leakage and able to make it to the bathroom in time before leakage   Today, pt demo'd improved form and technique with less lumbar lordosis in new band exercises after cues. Pt required tactile cues. Advanced pt to multidifis and throacolumbar and scapular strabilization HEP. Pt continues to benefit from skilled  PT. Plan to advance to standing  strengthening exercises and educate pt to integrative into fitness activities to minimize relapse of Sx.  Pt continues to benefit from skilled PT.     Examination-Activity Limitations Stand;Toileting    Stability/Clinical Decision Making Evolving/Moderate complexity    Rehab Potential Good    PT Frequency 1x / week    PT Duration Other (comment)   10   PT Treatment/Interventions Neuromuscular re-education;Scar mobilization;Functional mobility training;Gait training;Therapeutic activities;Therapeutic exercise;Patient/family education;Moist Heat;Manual techniques;Taping;Balance training;Cryotherapy    Consulted and Agree with Plan of Care Patient           Patient will benefit from skilled therapeutic intervention in order to improve the following deficits and impairments:  Hypomobility,Decreased scar mobility,Decreased mobility,Decreased coordination,Decreased range of motion,Decreased endurance,Decreased activity tolerance,Improper body mechanics,Increased fascial restricitons,Postural dysfunction,Pain,Difficulty walking,Increased edema,Decreased balance  Visit Diagnosis: Other lack of coordination  Muscle weakness (generalized)  Other abnormalities of gait and mobility     Problem List Patient Active Problem List   Diagnosis Date Noted  . 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 Essential hypertension 03/14/2019    [name] ,PT, DPT, E-RYT   04/06/2020, 3:57 PM  [facility] MAIN [facility] SERVICES [address] [place], [place], [address] Phone: [phone]   Fax:  [phone]  Name: Melanie Lester MRN: [id number] Date of Birth: June 04, 1970

## 2020-04-06 NOTE — Patient Instructions (Signed)
Multifidis twist  Band is on doorknob: stand further away from door (facing perpendicular)   Twisting trunk without moving the hips and knees Hold band at the level of ribcage, elbows bent,shoulder blades roll back and down like squeezing a pencil under armpit    Exhale twist,.10-15 deg away from door without moving your hips/ knees. Continue to maintain equal weight through legs. Keep knee unlocked.  10 x 2   ___  Minisquat with green band at door knob , hands by back pockets,thumb out , elbow straight  Scoot buttocks back slight, hinge like you are looking at your reflection on a pond  Knees behind toes,  Inhale to "smell flowers"  Exhale on the rise "like rocket"  Do not lock knees, have more weight across ballmounds of feet, toes relaxed   10 reps x 3 x day     __________ After menstrual cycle:  can resume the bridging part to these exercises.  Until then, practice the arm and shoulders part   Bridging series w/ resistive band other side of doorknob:  Level 1:  Position:  Elbows bent, knees hip width apart, heels under knees   Stabilization points: shoulders, upper arms, back of head pressed into floor. Heel press downward.   Movement: inhale do nothing, exhale pull band by side, lower fists to floor completely while lifting hips.Keep stabilization points engaged when you allow the band to go back to starting position  10 x 2 reps       Level 2:  Position:  Elbows straight, arms raised to ceiling at shoulder height, knees apart like a ballerina,heels together, heels under knees  Stabilization points: shoulders, upper arms, back of head pressed into floor. Heel press downward.   Movement: inhale do nothing, exhale pull band by side, lower fists to floor completely while lifting hips. Keep stabilization points engaged when you allow the band to go back to starting position   10 x 2 reps  Shoulder training: Try to imagine you are squeezing a pencil under your  armpit and your shoulder blades are down away from your ears and towards each other     __  Stretches  After  figure -4

## 2020-04-13 ENCOUNTER — Ambulatory Visit: Payer: 59 | Admitting: Physical Therapy

## 2020-04-21 ENCOUNTER — Ambulatory Visit: Payer: 59 | Attending: Family Medicine | Admitting: Physical Therapy

## 2020-04-21 ENCOUNTER — Other Ambulatory Visit: Payer: Self-pay

## 2020-04-21 DIAGNOSIS — R2689 Other abnormalities of gait and mobility: Secondary | ICD-10-CM | POA: Diagnosis present

## 2020-04-21 DIAGNOSIS — R278 Other lack of coordination: Secondary | ICD-10-CM | POA: Insufficient documentation

## 2020-04-21 DIAGNOSIS — M6281 Muscle weakness (generalized): Secondary | ICD-10-CM | POA: Insufficient documentation

## 2020-04-21 NOTE — Therapy (Addendum)
[facility] MAIN [facility] SERVICES [address] [place], [place], [address] Phone: [phone]   Fax:  [phone]  Physical Therapy Treatment  Patient Details  Name: Melanie Lester MRN: [id number] Date of Birth: 18-Aug-1970 Referring Provider (PT): Lorenda Cahill, FNP   Encounter Date: 04/21/2020   PT End of Session - 04/21/20 0848    Visit Number 14    Date for PT Re-Evaluation 06/15/20   PN 1/31   PT Start Time 0814    PT Stop Time 0900    PT Time Calculation (min) 46 min    Activity Tolerance Patient tolerated treatment well    Behavior During Therapy [facility] for tasks assessed/performed           Past Medical History:  Diagnosis Date  . COVID-24 Jul 2019   . Hypertension     Past Surgical History:  Procedure Laterality Date  . CESAREAN SECTION    . CHOLECYSTECTOMY    . VAGINAL HYSTERECTOMY      There were no vitals filed for this visit.   Subjective Assessment - 04/21/20 0822    Subjective Pt reported she did not experience pain during sexual intercourse and felt soreness afterwards which went away. Pt continues to go to the gym.    Pertinent History vaginal hysterectomy, C-section, Cholectectomy,              [facility] PT Assessment - 04/21/20 0849      Observation/Other Assessments   Observations lumbar lordosis, less genu valgus in lunges      Palpation   Palpation comment tightness along L lateral foot ( pt reported cramping along B toe the past 2 weeks, occurred during      Ambulation/Gait   Gait Comments toe walker, supination, minimal eccentric control of heel on stance                         [facility] Adult PT Treatment/Exercise - 04/21/20 0849      Therapeutic Activites    Other Therapeutic Activities fitness routine principles , stabilization principles for bridges      Neuro Re-ed    Neuro Re-ed Details  cued for lateral lunges and back ward lunges, metatarsal awareness, modified quad  stretch with leg on chair due to pt not able to to reach for ankle      Manual Therapy   Internal Pelvic Floor STM/MWM along lateral foot                       PT Long Term Goals - 04/21/20 0900      PT LONG TERM GOAL #1   Title Pt will report no leakage with every trip to the toilet for urination in order perform activities in the community    Time 8    Period Weeks    Status Achieved      PT LONG TERM GOAL #2   Title Pt will experience decreased pelvic pain from 2-3/10 to < 1 /10 in order to improve QOL  ( 9/13: 1.5/10     11/22:  1-3/10     12/13:  1/10    )    Time 6    Period Weeks    Status Achieved      PT LONG TERM GOAL #3   Title Pt will improve her FOTO score Urinary 43 pts to > 48 pts in order to improve QOL  (  12/13: 52 pts)    Time 10    Period Weeks    Status Achieved      PT LONG TERM GOAL #4   Title Pt will report no  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    Time 4    Period Weeks    Status Achieved      PT LONG TERM GOAL #5   Title Pt will demo increased SLS B from 1-2 sec to > 10 sec and increased R hip ext from 2/5 to > 4/5 , hip flexion 3/5 to > 4/5 in order to improve balance and minimze risk for falls ( 9/13: 12 sec B SLS,  hip ext/ flex 5/5  ,   12/13:  31 sec R, 30 sec L )    Time 10    Period Weeks    Status Achieved      PT LONG TERM GOAL #6   Title Pt will be IND with proper technique in fitness routine and machines and yoga practice in order to minimize injuries and relapse Sx    Time 10    Period Weeks    Status On-going      PT LONG TERM GOAL #7   Title Pt will demo no tightness / scar restrictions at pelvic floor and demo proper lengthening of pelvic floor to restore all functions of pelvic floor    Time 8    Period Weeks    Status On-going                 Plan - 04/21/20 0848    Clinical Impression Statement Pt regained sexual function of her pelvic floor as she reported no pain.   Propioception training with fitness was the focus on today's session with lateral lunges and principles of stability and COM over ballmounds and less at heels. Pt required manual Tx to minimize tightness of L lateral foot and will require further manual Tx to address hypomobility and her Hx of toe walking. Pt reported cramping in her B feet and first toe on LLE throughout exercises and required rest break. Anticipate her cramping will be less with more manual Tx at upcoming sessions to help maintain fitness routine for health and wellness and less overactivity of pelvic floor. Pt continues to benefit from skilled PT  Pt arrived 14 min late. Session was abbreviated    Examination-Activity Limitations Stand;Toileting    Stability/Clinical Decision Making Evolving/Moderate complexity    Rehab Potential Good    PT Frequency 1x / week    PT Duration Other (comment)   10   PT Treatment/Interventions Neuromuscular re-education;Scar mobilization;Functional mobility training;Gait training;Therapeutic activities;Therapeutic exercise;Patient/family education;Moist Heat;Manual techniques;Taping;Balance training;Cryotherapy    Consulted and Agree with Plan of Care Patient           Patient will benefit from skilled therapeutic intervention in order to improve the following deficits and impairments:  Hypomobility,Decreased scar mobility,Decreased mobility,Decreased coordination,Decreased range of motion,Decreased endurance,Decreased activity tolerance,Improper body mechanics,Increased fascial restricitons,Postural dysfunction,Pain,Difficulty walking,Increased edema,Decreased balance  Visit Diagnosis: Other lack of coordination  Other abnormalities of gait and mobility  Muscle weakness (generalized)     Problem List Patient Active Problem List   Diagnosis Date Noted  . 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 Essential hypertension 03/14/2019    [name] ,PT, DPT, E-RYT  04/21/2020, 9:01  AM  [facility] MAIN [facility] SERVICES [address] [place], [place], [address] Phone: [phone]  Fax:  [phone]  Name: Melanie Lester MRN: [id number] Date of Birth: 01-Jan-1971

## 2020-04-21 NOTE — Patient Instructions (Signed)
Lateral lunges:  Lean more, navel above knee line, ballmounds less heels  10 reps   __   ballmound rocking horse  navel over ballmounds   ___  Quad stretch over chair   Figure -4    Calf   Hamstring

## 2020-04-27 ENCOUNTER — Encounter: Payer: 59 | Admitting: Physical Therapy

## 2020-04-28 ENCOUNTER — Ambulatory Visit: Payer: 59 | Admitting: Physical Therapy

## 2020-04-28 ENCOUNTER — Other Ambulatory Visit: Payer: Self-pay

## 2020-04-28 DIAGNOSIS — R278 Other lack of coordination: Secondary | ICD-10-CM | POA: Diagnosis not present

## 2020-04-28 DIAGNOSIS — M6281 Muscle weakness (generalized): Secondary | ICD-10-CM

## 2020-04-28 DIAGNOSIS — R2689 Other abnormalities of gait and mobility: Secondary | ICD-10-CM

## 2020-04-28 NOTE — Patient Instructions (Signed)
Feet care :  Self -feet massage   Handshake : fingers between toes, moving ballmounds/toes back and forth several times while other hand anchors at arch. Do the same at the hind/mid foot.  Heel to toes upward to a letter Big Letter T strokes to spread ballmounds and toes, several times, pinch between webs of toes  Run finger tips along top of foot between long bones "comb between the bones"    Wiggle toes and spread them out when relaxing   ___  Step ups   Step up L up, R up, L down, R down, exhale up and down, lower heel slow and controlled, shoulder slightly forward, center of mass moves after ballmounds contacts  10 x 3 reps each   _________  Feet slides :   Points of contact at sitting bones  Four points of contact of foot, Heel up, ankle not twist out Lower heel Four points of contact of foot, Slide foot back   Repeated with other foot   _____  Walking with long steps, high knees, lunge lean, spread toes, equal weight bearing through ballmounds, lower heels

## 2020-04-28 NOTE — Therapy (Signed)
[facility] MAIN [facility] SERVICES [address] [place], [place], [address] Phone: [phone]   Fax:  [phone]  Physical Therapy Treatment  Patient Details  Name: Melanie Lester MRN: [id number] Date of Birth: [birth year] Referring Provider (PT): Lorenda Cahill, FNP   Encounter Date: 04/28/2020   PT End of Session - 04/28/20 0814    Visit Number 15    Date for PT Re-Evaluation 06/15/20   PN 1/31   PT Start Time 0804    PT Stop Time 0900    PT Time Calculation (min) 56 min    Activity Tolerance Patient tolerated treatment well    Behavior During Therapy [facility] for tasks assessed/performed           Past Medical History:  Diagnosis Date  . COVID-24 Jul 2019   . Hypertension     Past Surgical History:  Procedure Laterality Date  . CESAREAN SECTION    . CHOLECYSTECTOMY    . VAGINAL HYSTERECTOMY      There were no vitals filed for this visit.   Subjective Assessment - 04/28/20 0811    Subjective Pt reports she had leakage . Pt started a new medication which caused urinary frequency. Pt 's MD is aware of this Sx. All her life, she has been walking on her toes. Her feet feels more stressed after she works out at [employer] At the age of 2, pt wore casts on her feet and had to use crutches because her feet were turned in. Pt then got used to walking in cast and toe walking became a habit    Pertinent History vaginal hysterectomy, C-section, Cholectectomy,              [facility] PT Assessment - 04/28/20 0854      AROM   Overall AROM Comments limited DF B      Strength   Overall Strength Comments requried UE support, PF MMT 4/5 R 7reps, L 5 reps      Palpation   Palpation comment signficant tightness at digiti minimi, extensor digitorium brevis, adductor hallucis, both heads, hypomobile midfoot joints      Ambulation/Gait   Gait Comments limited hip flexion, short strides, limited DF in pre swing                          [facility] Adult PT Treatment/Exercise - 04/28/20 0855      Therapeutic Activites    Other Therapeutic Activities gait training      Neuro Re-ed    Neuro Re-ed Details  --      Manual Therapy   Manual therapy comments PA / AP mobs. distraction, STM/MWM to address signficant tightness at digiti minimi, extensor digitorium brevis, adductor hallucis, both heads, hypomobile midfoot joints and promote DF/EV and toe abduction B                       PT Long Term Goals - 04/21/20 0900      PT LONG TERM GOAL #1   Title Pt will report no leakage with every trip to the toilet for urination in order perform activities in the community    Time 8    Period Weeks    Status Achieved      PT LONG TERM GOAL #2   Title Pt will experience decreased pelvic pain from 2-3/10 to < 1 /10 in order to improve QOL  (  9/13: 1.5/10     11/22:  1-3/10     12/13:  1/10    )    Time 6    Period Weeks    Status Achieved      PT LONG TERM GOAL #3   Title Pt will improve her FOTO score Urinary 43 pts to > 48 pts in order to improve QOL  ( 12/13: 52 pts)    Time 10    Period Weeks    Status Achieved      PT LONG TERM GOAL #4   Title Pt will report no  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    Time 4    Period Weeks    Status Achieved      PT LONG TERM GOAL #5   Title Pt will demo increased SLS B from 1-2 sec to > 10 sec and increased R hip ext from 2/5 to > 4/5 , hip flexion 3/5 to > 4/5 in order to improve balance and minimze risk for falls ( 9/13: 12 sec B SLS,  hip ext/ flex 5/5  ,   12/13:  31 sec R, 30 sec L )    Time 10    Period Weeks    Status Achieved      PT LONG TERM GOAL #6   Title Pt will be IND with proper technique in fitness routine and machines and yoga practice in order to minimize injuries and relapse Sx    Time 10    Period Weeks    Status On-going      PT LONG TERM GOAL #7   Title Pt will demo no tightness /  scar restrictions at pelvic floor and demo proper lengthening of pelvic floor to restore all functions of pelvic floor    Time 8    Period Weeks    Status On-going                 Plan - 04/28/20 4034    Clinical Impression Statement Addressed pt's lower kinetic chain to minimzie overactivity of pelvic floor. Pt showed signficantly decreased PF strength, limited DF AROM as she had a Hx of toe walking after undergoing feet surgery at the age of 2. Pt demo'd improved AROM at ankle for DF and toe abduction. Pt required cues for eccentric control of PF . Pt continues to benefit from skilled PT to return to fitness with less risks of pelvic floor dysfunctions.    Examination-Activity Limitations Stand;Toileting    Stability/Clinical Decision Making Evolving/Moderate complexity    Rehab Potential Good    PT Frequency 1x / week    PT Duration Other (comment)   10   PT Treatment/Interventions Neuromuscular re-education;Scar mobilization;Functional mobility training;Gait training;Therapeutic activities;Therapeutic exercise;Patient/family education;Moist Heat;Manual techniques;Taping;Balance training;Cryotherapy    Consulted and Agree with Plan of Care Patient           Patient will benefit from skilled therapeutic intervention in order to improve the following deficits and impairments:  Hypomobility,Decreased scar mobility,Decreased mobility,Decreased coordination,Decreased range of motion,Decreased endurance,Decreased activity tolerance,Improper body mechanics,Increased fascial restricitons,Postural dysfunction,Pain,Difficulty walking,Increased edema,Decreased balance  Visit Diagnosis: No diagnosis found.     Problem List Patient Active Problem List   Diagnosis Date Noted  . 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 Essential hypertension 03/14/2019    [name] ,PT, DPT, E-RYT  04/28/2020, 9:02 AM  [place] [facility] MAIN [facility] SERVICES [address] [place],  [place], [address] Phone: [phone]   Fax:  [phone]  Name: Melanie Lester MRN: [id number] Date of Birth: 08-15-1970

## 2020-05-04 ENCOUNTER — Encounter: Payer: 59 | Admitting: Physical Therapy

## 2020-05-05 ENCOUNTER — Other Ambulatory Visit: Payer: Self-pay

## 2020-05-05 ENCOUNTER — Ambulatory Visit: Payer: 59 | Attending: Family Medicine | Admitting: Physical Therapy

## 2020-05-05 DIAGNOSIS — R278 Other lack of coordination: Secondary | ICD-10-CM | POA: Insufficient documentation

## 2020-05-05 DIAGNOSIS — M6281 Muscle weakness (generalized): Secondary | ICD-10-CM | POA: Insufficient documentation

## 2020-05-05 DIAGNOSIS — R2689 Other abnormalities of gait and mobility: Secondary | ICD-10-CM | POA: Diagnosis present

## 2020-05-05 NOTE — Patient Instructions (Signed)
   Feet slides : WITH RED BAND at thigh    Points of contact at sitting bones  Four points of contact of foot, Heel up, ankle not twist out Lower heel with knee pushed out along 2-3rd toe line  Four points of contact of foot, Slide foot back   Repeated with other foot    2 min   ___  Calve/ hamstring stretches seated with strap Toes spread, ballmounds wide

## 2020-05-05 NOTE — Therapy (Addendum)
[facility] MAIN [facility] SERVICES [address] [place], [place], [address] Phone: [phone]   Fax:  [phone]  Physical Therapy Treatment /Discharge Summary   Patient Details  Name: Melanie Lester MRN: [id number] Date of Birth: [birth year] Referring Provider (PT): Lorenda Cahill, FNP   Encounter Date: 05/05/2020   PT End of Session - 05/05/20 0858     Visit Number 16    Date for PT Re-Evaluation 06/15/20   PN 1/31   PT Start Time 0810    PT Stop Time 0900    PT Time Calculation (min) 50 min    Activity Tolerance Patient tolerated treatment well    Behavior During Therapy [facility] for tasks assessed/performed             Past Medical History:  Diagnosis Date   COVID-24 Jul 2019    Hypertension     Past Surgical History:  Procedure Laterality Date   CESAREAN SECTION     CHOLECYSTECTOMY     VAGINAL HYSTERECTOMY      There were no vitals filed for this visit.   Subjective Assessment - 05/05/20 0814     Subjective Pt reports she has been able to lower her heels but it has been achallenge    Pertinent History vaginal hysterectomy, C-section, Cholectectomy,                [facility] PT Assessment - 05/05/20 0815       Observation/Other Assessments   Observations genu valgus with eccentric lowering of heel      Strength   Overall Strength Comments without UE support 5 reps RLE, 7 reps LLE      Palpation   Palpation comment tightness between digit I-II, II-III, III-IV , cuneiform/navicular                           [facility] Adult PT Treatment/Exercise - 05/05/20 5397       Neuro Re-ed    Neuro Re-ed Details  cued for knee alignment with eccentric control of heel R , strap stretches for calves      Manual Therapy   Manual therapy comments PA/AP mob GRade II-III, STM/MWM to address tightness between digit I-II, II-III, III-IV , cuneiform/navicular R                         PT Long Term  Goals - 04/21/20 0900       PT LONG TERM GOAL #1   Title Pt will report no leakage with every trip to the toilet for urination in order perform activities in the community    Time 8    Period Weeks    Status Achieved      PT LONG TERM GOAL #2   Title Pt will experience decreased pelvic pain from 2-3/10 to < 1 /10 in order to improve QOL  ( 9/13: 1.5/10     11/22:  1-3/10     12/13:  1/10    )    Time 6    Period Weeks    Status Achieved      PT LONG TERM GOAL #3   Title Pt will improve her FOTO score Urinary 43 pts to > 48 pts in order to improve QOL  ( 12/13: 52 pts)    Time 10    Period Weeks    Status Achieved  PT LONG TERM GOAL #4   Title Pt will report no  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    Time 4    Period Weeks    Status Achieved      PT LONG TERM GOAL #5   Title Pt will demo increased SLS B from 1-2 sec to > 10 sec and increased R hip ext from 2/5 to > 4/5 , hip flexion 3/5 to > 4/5 in order to improve balance and minimze risk for falls ( 9/13: 12 sec B SLS,  hip ext/ flex 5/5  ,   12/13:  31 sec R, 30 sec L )    Time 10    Period Weeks    Status Achieved      PT LONG TERM GOAL #6   Title Pt will be IND with proper technique in fitness routine and machines and yoga practice in order to minimize injuries and relapse Sx    Time 10    Period Weeks    Status Achieved     PT LONG TERM GOAL #7   Title Pt will demo no tightness / scar restrictions at pelvic floor and demo proper lengthening of pelvic floor to restore all functions of pelvic floor    Time 8    Period Weeks    Status Achieved                   Plan - 05/05/20 6606     Clinical Impression Statement Pt achieved 100% of her goals with no more leakage, pelvic pain, and decreased pelvic floor tightness. Pt achieved more SIJ and hip and spinal mobility and is IND with modifications to fitness exercises to minimize overactivity of pelvic floor and minimize  lower kinetic chain deficits.   Today, Pt reported less cramping  In her feet after last session. Continued to require manual Tx to minimize tightness/ hypomobility at R foot. Pt will still need more manual Tx at forefoot/midfoot at next session.  Regional interdependence approach has been helpful for pt as pt had originally presented with R pelvic floor tightness and pelvic floor overactivity relapse can be minimized by addressing her R lower kinetic chain deficits.   Pt will have greater outcomes in fitness routine and minimize risk for injuries.  Pt self-discharged after this session.   Pt arrived 10 min late today thus, session was abbreviated.       Examination-Activity Limitations Stand;Toileting    Stability/Clinical Decision Making Evolving/Moderate complexity    Rehab Potential Good    PT Frequency 1x / week    PT Duration Other (comment)   10   PT Treatment/Interventions Neuromuscular re-education;Scar mobilization;Functional mobility training;Gait training;Therapeutic activities;Therapeutic exercise;Patient/family education;Moist Heat;Manual techniques;Taping;Balance training;Cryotherapy    Consulted and Agree with Plan of Care Patient             Patient will benefit from skilled therapeutic intervention in order to improve the following deficits and impairments:  Hypomobility,Decreased scar mobility,Decreased mobility,Decreased coordination,Decreased range of motion,Decreased endurance,Decreased activity tolerance,Improper body mechanics,Increased fascial restricitons,Postural dysfunction,Pain,Difficulty walking,Increased edema,Decreased balance  Visit Diagnosis: Other lack of coordination  Other abnormalities of gait and mobility  Muscle weakness (generalized)     Problem List Patient Active Problem List   Diagnosis Date Noted   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

## 2020-05-12 ENCOUNTER — Encounter: Payer: 59 | Admitting: Physical Therapy

## 2020-05-18 ENCOUNTER — Encounter: Payer: 59 | Admitting: Physical Therapy

## 2020-06-16 ENCOUNTER — Ambulatory Visit: Payer: 59 | Attending: Family Medicine | Admitting: Physical Therapy

## 2020-06-22 ENCOUNTER — Ambulatory Visit: Payer: 59 | Admitting: Physical Therapy

## 2020-06-29 ENCOUNTER — Ambulatory Visit: Payer: 59 | Admitting: Physical Therapy

## 2020-07-06 ENCOUNTER — Ambulatory Visit: Payer: 59 | Admitting: Physical Therapy

## 2020-10-05 ENCOUNTER — Other Ambulatory Visit: Payer: Self-pay

## 2020-10-08 ENCOUNTER — Ambulatory Visit (INDEPENDENT_AMBULATORY_CARE_PROVIDER_SITE_OTHER): Payer: 59 | Admitting: Podiatry

## 2020-10-08 ENCOUNTER — Other Ambulatory Visit: Payer: Self-pay

## 2020-10-08 DIAGNOSIS — L603 Nail dystrophy: Secondary | ICD-10-CM

## 2020-10-08 DIAGNOSIS — L600 Ingrowing nail: Secondary | ICD-10-CM

## 2020-10-13 ENCOUNTER — Encounter: Payer: Self-pay | Admitting: Podiatry

## 2020-10-13 NOTE — Progress Notes (Signed)
Subjective:  Patient ID: Melanie Lester, female    DOB: August 28, 1970,  MRN: [id number]  Chief Complaint  Patient presents with   Ingrown Toenail    Right hallux ingrown for about a week  Pt stated that she has some drainage coming from it and it is painful     50 y.o. female presents with the above complaint.  Patient presents with new complaint of right medial border ingrown.  Patient states is painful to touch.  Patient drainage is painful.  She would like to have removed.  She has not seen anyone else prior to seeing me.  She saw Dr. Al Corpus in the past for different complaint.  She denies any other issues.  No redness.  She does not take any antibiotics.   Review of Systems: Negative except as noted in the HPI. Denies N/V/F/Ch.  Past Medical History:  Diagnosis Date   COVID-24 Jul 2019    Hypertension     Current Outpatient Medications:    albuterol (VENTOLIN HFA) 108 (90 Base) MCG/ACT inhaler, Inhale 2 puffs into the lungs every 6 (six) hours as needed for wheezing or shortness of breath., Disp: 6.7 g, Rfl: 0   chlorthalidone (HYGROTON) 25 MG tablet, Take by mouth., Disp: , Rfl:    cyanocobalamin 100 MCG tablet, Take by mouth., Disp: , Rfl:    cyclobenzaprine (FLEXERIL) 5 MG tablet, Take 1 tablet (5 mg total) by mouth at bedtime., Disp: 15 tablet, Rfl: 0   diclofenac (VOLTAREN) 50 MG EC tablet, diclofenac sodium 50 mg tablet,delayed release  1 BY MOUTH TWICE DAILY AS NEEDED FOR PAIN TAKE WITH FOOD, Disp: , Rfl:    ergocalciferol (VITAMIN D2) 1.25 MG (50000 UT) capsule, ergocalciferol (vitamin D2) 1,250 mcg (50,000 unit) capsule  TAKE 1 CAPSULE BY MOUTH WEEKLY FOR 12 WEEKS, Disp: , Rfl:    furosemide (LASIX) 20 MG tablet, Take 20 mg by mouth daily., Disp: , Rfl:    ketorolac (TORADOL) 10 MG tablet, Take 1 tablet (10 mg total) by mouth every 8 (eight) hours., Disp: 15 tablet, Rfl: 0   Naproxen Sodium 220 MG CAPS, Take by mouth., Disp: , Rfl:   Social History   Tobacco Use   Smoking Status Never  Smokeless Tobacco Never    Allergies  Allergen Reactions   Shellfish Allergy Anaphylaxis   Clindamycin/Lincomycin Swelling   Codeine Swelling   Latex Rash   Objective:  There were no vitals filed for this visit. There is no height or weight on file to calculate BMI. Constitutional Well developed. Well nourished.  Vascular Dorsalis pedis pulses palpable bilaterally. Posterior tibial pulses palpable bilaterally. Capillary refill normal to all digits.  No cyanosis or clubbing noted. Pedal hair growth normal.  Neurologic Normal speech. Oriented to person, place, and time. Epicritic sensation to light touch grossly present bilaterally.  Dermatologic Painful ingrowing nail at medial nail borders of the hallux nail right. No other open wounds. No skin lesions.  Orthopedic: Normal joint ROM without pain or crepitus bilaterally. No visible deformities. No bony tenderness.   Radiographs: None Assessment:   1. Ingrown toenail of right foot   2. Onychodystrophy    Plan:  Patient was evaluated and treated and all questions answered.  Ingrown Nail, right with underlying nail dystrophy -Patient elects to proceed with minor surgery to remove ingrown toenail removal today. Consent reviewed and signed by patient. -Ingrown nail excised. See procedure note. -Educated on post-procedure care including soaking. Written instructions provided and reviewed. -Patient  to follow up in 2 weeks for nail check.  Procedure: Excision of Ingrown Toenail Location: Right 1st toe medial ingrown right nail borders. Anesthesia: Lidocaine 1% plain; 1.5 mL and Marcaine 0.5% plain; 1.5 mL, digital block. Skin Prep: Betadine. Dressing: Silvadene; telfa; dry, sterile, compression dressing. Technique: Following skin prep, the toe was exsanguinated and a tourniquet was secured at the base of the toe. The affected nail border was freed, split with a nail splitter, and excised. Chemical  matrixectomy was then performed with phenol and irrigated out with alcohol. The tourniquet was then removed and sterile dressing applied. Disposition: Patient tolerated procedure well. Patient to return in 2 weeks for follow-up.   No follow-ups on file.

## 2021-07-30 ENCOUNTER — Telehealth: Payer: Self-pay

## 2021-07-30 ENCOUNTER — Other Ambulatory Visit: Payer: Self-pay | Admitting: Family Medicine

## 2021-07-30 DIAGNOSIS — Z1231 Encounter for screening mammogram for malignant neoplasm of breast: Secondary | ICD-10-CM

## 2021-07-30 NOTE — Telephone Encounter (Signed)
CALLED PATIENT NO ANSWER LEFT VOICEMAIL FOR A CALL BACK ? ?

## 2021-08-03 ENCOUNTER — Telehealth: Payer: Self-pay

## 2021-08-03 ENCOUNTER — Other Ambulatory Visit: Payer: Self-pay

## 2021-08-03 DIAGNOSIS — Z1211 Encounter for screening for malignant neoplasm of colon: Secondary | ICD-10-CM

## 2021-08-03 MED ORDER — NA SULFATE-K SULFATE-MG SULF 17.5-3.13-1.6 GM/177ML PO SOLN: 1.0000 | Freq: Once | ORAL | 0 refills | Status: AC

## 2021-08-03 NOTE — Telephone Encounter (Signed)
Called endo and canceled to

## 2021-08-03 NOTE — Progress Notes (Signed)
Gastroenterology Pre-Procedure Review  Request Date: 09/02/2021 Requesting Physician: Dr. Tobi Bastos  PATIENT REVIEW QUESTIONS: The patient responded to the following health history questions as indicated:    1. Are you having any GI issues? no 2. Do you have a personal history of Polyps? no 3. Do you have a family history of Colon Cancer or Polyps? yes (COLON CANCER) 4. Diabetes Mellitus? no 5. Joint replacements in the past 12 months?no 6. Major health problems in the past 3 months?no 7. Any artificial heart valves, MVP, or defibrillator?no    MEDICATIONS & ALLERGIES:    Patient reports the following regarding taking any anticoagulation/antiplatelet therapy:   Plavix, Coumadin, Eliquis, Xarelto, Lovenox, Pradaxa, Brilinta, or Effient? no Aspirin? no  Patient confirms/reports the following medications:  Current Outpatient Medications  Medication Sig Dispense Refill   albuterol (VENTOLIN HFA) 108 (90 Base) MCG/ACT inhaler Inhale 2 puffs into the lungs every 6 (six) hours as needed for wheezing or shortness of breath. 6.7 g 0   chlorthalidone (HYGROTON) 25 MG tablet Take by mouth.     cyanocobalamin 100 MCG tablet Take by mouth.     cyclobenzaprine (FLEXERIL) 5 MG tablet Take 1 tablet (5 mg total) by mouth at bedtime. 15 tablet 0   diclofenac (VOLTAREN) 50 MG EC tablet diclofenac sodium 50 mg tablet,delayed release  1 BY MOUTH TWICE DAILY AS NEEDED FOR PAIN TAKE WITH FOOD     ergocalciferol (VITAMIN D2) 1.25 MG (50000 UT) capsule ergocalciferol (vitamin D2) 1,250 mcg (50,000 unit) capsule  TAKE 1 CAPSULE BY MOUTH WEEKLY FOR 12 WEEKS     furosemide (LASIX) 20 MG tablet Take 20 mg by mouth daily.     ketorolac (TORADOL) 10 MG tablet Take 1 tablet (10 mg total) by mouth every 8 (eight) hours. 15 tablet 0   Naproxen Sodium 220 MG CAPS Take by mouth.     No current facility-administered medications for this visit.    Patient confirms/reports the following allergies:  Allergies  Allergen  Reactions   Shellfish Allergy Anaphylaxis   Clindamycin/Lincomycin Swelling   Codeine Swelling   Latex Rash    No orders of the defined types were placed in this encounter.   AUTHORIZATION INFORMATION Primary Insurance: 1D#: Group #:  Secondary Insurance: 1D#: Group #:  SCHEDULE INFORMATION: Date: 09/02/2021 Time: Location:[facility]

## 2021-08-03 NOTE — Telephone Encounter (Signed)
Called patient back and canceled her procedure we dont accept her insurance

## 2021-08-26 ENCOUNTER — Ambulatory Visit
Admission: RE | Admit: 2021-08-26 | Discharge: 2021-08-26 | Disposition: A | Discharge status: Home / Self Care | Payer: BLUE CROSS/BLUE SHIELD | Source: Ambulatory Visit | Attending: Family Medicine | Admitting: Family Medicine

## 2021-08-26 DIAGNOSIS — Z1231 Encounter for screening mammogram for malignant neoplasm of breast: Secondary | ICD-10-CM

## 2021-08-27 ENCOUNTER — Other Ambulatory Visit: Payer: Self-pay | Admitting: Family Medicine

## 2021-09-01 ENCOUNTER — Other Ambulatory Visit: Payer: Self-pay | Admitting: Family Medicine

## 2021-09-01 DIAGNOSIS — R928 Other abnormal and inconclusive findings on diagnostic imaging of breast: Secondary | ICD-10-CM

## 2021-09-01 DIAGNOSIS — N63 Unspecified lump in unspecified breast: Secondary | ICD-10-CM

## 2021-09-02 ENCOUNTER — Ambulatory Visit: Admit: 2021-09-02 | Payer: BLUE CROSS/BLUE SHIELD | Admitting: Gastroenterology

## 2021-09-02 SURGERY — COLONOSCOPY WITH PROPOFOL
Anesthesia: General

## 2021-09-23 ENCOUNTER — Ambulatory Visit
Admission: RE | Admit: 2021-09-23 | Discharge: 2021-09-23 | Disposition: A | Discharge status: Home / Self Care | Payer: BLUE CROSS/BLUE SHIELD | Source: Ambulatory Visit | Attending: Family Medicine | Admitting: Family Medicine

## 2021-09-23 DIAGNOSIS — R928 Other abnormal and inconclusive findings on diagnostic imaging of breast: Secondary | ICD-10-CM

## 2021-09-23 DIAGNOSIS — N63 Unspecified lump in unspecified breast: Secondary | ICD-10-CM

## 2022-02-09 ENCOUNTER — Ambulatory Visit
Admission: EM | Admit: 2022-02-09 | Discharge: 2022-02-09 | Disposition: A | Discharge status: Home / Self Care | Payer: BLUE CROSS/BLUE SHIELD | Attending: Emergency Medicine | Admitting: Emergency Medicine

## 2022-02-09 DIAGNOSIS — J45909 Unspecified asthma, uncomplicated: Secondary | ICD-10-CM | POA: Insufficient documentation

## 2022-02-09 DIAGNOSIS — J101 Influenza due to other identified influenza virus with other respiratory manifestations: Secondary | ICD-10-CM | POA: Insufficient documentation

## 2022-02-09 DIAGNOSIS — Z79899 Other long term (current) drug therapy: Secondary | ICD-10-CM | POA: Insufficient documentation

## 2022-02-09 DIAGNOSIS — R000 Tachycardia, unspecified: Secondary | ICD-10-CM | POA: Insufficient documentation

## 2022-02-09 DIAGNOSIS — Z7952 Long term (current) use of systemic steroids: Secondary | ICD-10-CM | POA: Insufficient documentation

## 2022-02-09 DIAGNOSIS — I10 Essential (primary) hypertension: Secondary | ICD-10-CM | POA: Insufficient documentation

## 2022-02-09 DIAGNOSIS — J069 Acute upper respiratory infection, unspecified: Secondary | ICD-10-CM

## 2022-02-09 DIAGNOSIS — Z1152 Encounter for screening for COVID-19: Secondary | ICD-10-CM | POA: Insufficient documentation

## 2022-02-09 LAB — RESP PANEL BY RT-PCR (FLU A&B, COVID) ARPGX2
Influenza A by PCR: POSITIVE — AB
Influenza B by PCR: NEGATIVE
SARS Coronavirus 2 by RT PCR: NEGATIVE

## 2022-02-09 MED ORDER — ACETAMINOPHEN 325 MG PO TABS
650.0000 mg | ORAL_TABLET | Freq: Once | ORAL | Status: AC
  Administered 2022-02-09: 650 mg via ORAL

## 2022-02-09 MED ORDER — BENZONATATE 100 MG PO CAPS: 100.0000 mg | ORAL_CAPSULE | Freq: Three times a day (TID) | ORAL | 0 refills | Status: AC

## 2022-02-09 MED ORDER — OSELTAMIVIR PHOSPHATE 75 MG PO CAPS: 75.0000 mg | ORAL_CAPSULE | Freq: Two times a day (BID) | ORAL | 0 refills | Status: AC

## 2022-02-09 MED ORDER — PREDNISONE 20 MG PO TABS: 40.0000 mg | ORAL_TABLET | Freq: Every day | ORAL | 0 refills | Status: DC

## 2022-02-09 NOTE — ED Triage Notes (Addendum)
Chief Complaint: Cough that is slightly productive. States having SOB and heaviness in the chest. Tested negative for COVID was not tested for Flu. Patient does have a history of allergies. Cough is worse today. Patient has asthma.   Onset: 2 weeks  Prescriptions or OTC medications tried: Yes- tylenol, cough drops, aleve last taken this morning around 3-4 am    with mild relief  Sick exposure: No  New foods, medications, or products: No  Recent Travel: No

## 2022-02-09 NOTE — ED Provider Notes (Signed)
[facility]    CSN: [id number] Arrival date & time: 02/09/22  1318      History   Chief Complaint Chief Complaint  Patient presents with   Cough    HPI ARDEEN NOSKO is a 51 y.o. female.   Patient presents with rhinorrhea, sore throat, intermittent productive cough and shortness of breath at rest for 2 weeks.  Cough is worsened and there is a sensation of heaviness to the chest..  Has had a decreased appetite over the last 2 days.  No known sick contacts prior . decreased appetitie over the last two days,.  Has attempted use of Tylenol and Aleve which has been minimally effective.  Denies recent travel.  History of seasonal allergies and asthma.  Completed home COVID test within the last 2 weeks now, negative.   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      Home Medications    Prior to Admission medications   Medication Sig Start Date End Date Taking? Authorizing Provider  albuterol (VENTOLIN HFA) 108 (90 Base) MCG/ACT inhaler Inhale 2 puffs into the lungs every 6 (six) hours as needed for wheezing or shortness of breath. 07/09/19   Duffy Bruce, MD  B-D 3CC LUER-LOK SYR 22GX1" 22G X 1" 3 ML MISC SMARTSIG:injection As Directed 07/26/21   [provider]  chlorthalidone (HYGROTON) 25 MG tablet Take by mouth.    [provider]  cyanocobalamin (,VITAMIN B-12,) 1000 MCG/ML injection Inject 1,000 mcg into the muscle every 30 (thirty) days. 07/26/21   [provider]  cyanocobalamin 100 MCG tablet Take by mouth.    [provider]  cyclobenzaprine (FLEXERIL) 5 MG tablet Take 1  tablet (5 mg total) by mouth at bedtime. 08/26/17   Menshew, Dannielle Karvonen, PA-C  diclofenac (VOLTAREN) 50 MG EC tablet diclofenac sodium 50 mg tablet,delayed release  1 BY MOUTH TWICE DAILY AS NEEDED FOR PAIN TAKE WITH FOOD    [provider]  ergocalciferol (VITAMIN D2) 1.25 MG (50000 UT) capsule ergocalciferol (vitamin D2) 1,250 mcg (50,000 unit) capsule  TAKE 1 CAPSULE BY MOUTH WEEKLY FOR 12 WEEKS    [provider]  furosemide (LASIX) 20 MG tablet Take 20 mg by mouth daily. 07/13/19   [provider]  ketorolac (TORADOL) 10 MG tablet Take 1 tablet (10 mg total) by mouth every 8 (eight) hours. 08/26/17   Menshew, Dannielle Karvonen, PA-C  Naproxen Sodium 220 MG CAPS Take by mouth.    [provider]    Family History Family History  Problem Relation Age of Onset   Hypertension Mother    Diabetes Mother    Hypertension Father    Diabetes Father    Heart attack Father    Tuberculosis Brother    Bone cancer Maternal Grandmother    Breast cancer Neg Hx     Social History Social History   Tobacco Use   Smoking status: Never   Smokeless tobacco: Never  Substance Use Topics   Alcohol use: No   Drug use: No  Allergies   Shellfish allergy, Clindamycin/lincomycin, Codeine, and Latex   Review of Systems Review of Systems  Constitutional: Negative.   HENT:  Positive for rhinorrhea and sore throat. Negative for congestion, dental problem, drooling, ear discharge, ear pain, facial swelling, hearing loss, mouth sores, nosebleeds, postnasal drip, sinus pressure, sinus pain, sneezing, tinnitus, trouble swallowing and voice change.   Respiratory:  Positive for cough and shortness of breath. Negative for apnea, choking, chest tightness, wheezing and stridor.   Cardiovascular: Negative.   Gastrointestinal: Negative.   Skin: Negative.   All other systems reviewed and are negative.    Physical Exam Triage Vital Signs ED Triage Vitals  Enc Vitals  Group     BP 02/09/22 1420 133/84     Pulse Rate 02/09/22 1420 (!) 107     Resp 02/09/22 1420 16     Temp 02/09/22 1420 (!) 102.9 F (39.4 C)     Temp Source 02/09/22 1420 Oral     SpO2 02/09/22 1420 93 %     Weight --      Height --      Head Circumference --      Peak Flow --      Pain Score 02/09/22 1418 6     Pain Loc --      Pain Edu? --      Excl. in GC? --    No data found.  Updated Vital Signs BP 133/84 (BP Location: Right Arm)   Pulse (!) 107   Temp (!) 102.9 F (39.4 C) (Oral)   Resp 16   SpO2 93%   Visual Acuity Right Eye Distance:   Left Eye Distance:   Bilateral Distance:    Right Eye Near:   Left Eye Near:    Bilateral Near:     Physical Exam Constitutional:      Appearance: Normal appearance.  HENT:     Head: Normocephalic.     Right Ear: Tympanic membrane, ear canal and external ear normal.     Left Ear: Tympanic membrane, ear canal and external ear normal.     Nose: No congestion or rhinorrhea.     Mouth/Throat:     Mouth: Mucous membranes are moist.     Pharynx: Posterior oropharyngeal erythema present.  Cardiovascular:     Rate and Rhythm: Normal rate and regular rhythm.     Pulses: Normal pulses.     Heart sounds: Normal heart sounds.  Pulmonary:     Effort: Pulmonary effort is normal.     Breath sounds: Normal breath sounds.  Skin:    General: Skin is warm and dry.  Neurological:     Mental Status: She is alert and oriented to person, place, and time. Mental status is at baseline.  Psychiatric:        Mood and Affect: Mood normal.        Behavior: Behavior normal.      UC Treatments / Results  Labs (all labs ordered are listed, but only abnormal results are displayed) Labs Reviewed - No data to display  EKG   Radiology No results found.  Procedures Procedures (including critical care time)  Medications Ordered in UC Medications  acetaminophen (TYLENOL) tablet 650 mg (650 mg Oral Given 02/09/22 1426)    Initial  Impression / Assessment and Plan / UC Course  I have reviewed the triage vital signs and the nursing notes.  Pertinent labs & imaging results that were available during my care of the patient were  reviewed by me and considered in my medical decision making (see chart for details).  Acute upper respiratory illness  Fever of 102.9 with associated tachycardia noted in triage, the patient has not experienced fever prior therefore retesting for COVID and flu testing completed, pending, will receive antivirals if positive due to history of asthma and hypertension, if testing negative as symptoms have persisted for 2 weeks will receive antibiotic, discussed with patient, prednisone prescribed for management of shortness of breath, may continue use of rescue inhaler as needed, prescribed Tessalon additionally for management of coughing, may use additional over-the-counter medications as needed for supportive care with urgent care follow-up if symptoms persist or worsen Final Clinical Impressions(s) / UC Diagnoses   Final diagnoses:  None   Discharge Instructions   None    ED Prescriptions   None    PDMP not reviewed this encounter.   Hans Eden, NP 02/09/22 1454

## 2022-02-09 NOTE — Discharge Instructions (Addendum)
Today we are treating you for cough and shortness of breath as it has been present for 2 weeks  As you have a new fever we are testing for COVID and flu, if positive you will receive antiviral treatment at time of notification  Starting tomorrow morning begin prednisone every morning with food for 5 days, this helps to reduce inflammation and irritation to the airways which will help calm your shortness of breath and ideally your wheezing  You may take Tessalon pill every 8 hours to help calm your coughing, you may purchase over-the-counter Delsym tablets to be used additionally  For fevers you may use Tylenol 500 to 1000 mg every 6 hours and/or ibuprofen 600 to 800 mg every 6-8 hours    You can take Tylenol and/or Ibuprofen as needed for fever reduction and pain relief.   For cough: honey 1/2 to 1 teaspoon (you can dilute the honey in water or another fluid).  You can also use guaifenesin and dextromethorphan for cough. You can use a humidifier for chest congestion and cough.  If you don't have a humidifier, you can sit in the bathroom with the hot shower running.      For sore throat: try warm salt water gargles, cepacol lozenges, throat spray, warm tea or water with lemon/honey, popsicles or ice, or OTC cold relief medicine for throat discomfort.   For congestion: take a daily anti-histamine like Zyrtec, Claritin, and a oral decongestant, such as pseudoephedrine.  You can also use Flonase 1-2 sprays in each nostril daily.   It is important to stay hydrated: drink plenty of fluids (water, gatorade/powerade/pedialyte, juices, or teas) to keep your throat moisturized and help further relieve irritation/discomfort.

## 2022-08-11 ENCOUNTER — Encounter: Payer: Self-pay | Admitting: Family Medicine

## 2022-08-12 ENCOUNTER — Other Ambulatory Visit: Payer: Self-pay | Admitting: Family Medicine

## 2022-08-12 DIAGNOSIS — N63 Unspecified lump in unspecified breast: Secondary | ICD-10-CM

## 2022-09-01 ENCOUNTER — Ambulatory Visit
Admission: RE | Admit: 2022-09-01 | Discharge: 2022-09-01 | Disposition: A | Discharge status: Home / Self Care | Payer: BLUE CROSS/BLUE SHIELD | Source: Ambulatory Visit | Attending: Family Medicine | Admitting: Family Medicine

## 2022-09-01 DIAGNOSIS — N63 Unspecified lump in unspecified breast: Secondary | ICD-10-CM | POA: Insufficient documentation

## 2022-09-07 ENCOUNTER — Other Ambulatory Visit: Payer: Self-pay | Admitting: Family Medicine

## 2022-09-07 DIAGNOSIS — N63 Unspecified lump in unspecified breast: Secondary | ICD-10-CM

## 2022-09-07 DIAGNOSIS — R928 Other abnormal and inconclusive findings on diagnostic imaging of breast: Secondary | ICD-10-CM

## 2022-09-19 ENCOUNTER — Ambulatory Visit
Admission: RE | Admit: 2022-09-19 | Discharge: 2022-09-19 | Disposition: A | Discharge status: Home / Self Care | Payer: BLUE CROSS/BLUE SHIELD | Source: Ambulatory Visit | Attending: Family Medicine | Admitting: Family Medicine

## 2022-09-19 DIAGNOSIS — R928 Other abnormal and inconclusive findings on diagnostic imaging of breast: Secondary | ICD-10-CM | POA: Insufficient documentation

## 2022-09-19 DIAGNOSIS — N63 Unspecified lump in unspecified breast: Secondary | ICD-10-CM

## 2022-09-19 HISTORY — PX: BREAST BIOPSY: SHX20

## 2022-09-19 MED ORDER — LIDOCAINE-EPINEPHRINE 1 %-1:100000 IJ SOLN
5.0000 mL | Freq: Once | INTRAMUSCULAR | Status: AC
  Administered 2022-09-19: 5 mL
  Filled 2022-09-19: qty 5

## 2022-09-19 MED ORDER — LIDOCAINE HCL 1 % IJ SOLN
2.0000 mL | Freq: Once | INTRAMUSCULAR | Status: AC
  Administered 2022-09-19: 2 mL via INTRADERMAL
  Filled 2022-09-19: qty 2

## 2022-12-13 IMAGING — MG MM DIGITAL SCREENING BILAT W/ TOMO AND CAD
8 series · 8 of 24 positions shown · non-contrast
Comparison: Previous exam(s).

CLINICAL DATA: Screening.

EXAM:
DIGITAL SCREENING BILATERAL MAMMOGRAM WITH TOMOSYNTHESIS AND CAD
TECHNIQUE: Bilateral screening digital craniocaudal and mediolateral oblique
mammograms were obtained. Bilateral screening digital breast
tomosynthesis was performed. The images were evaluated with
computer-aided detection.

[R MLO synth-2D]
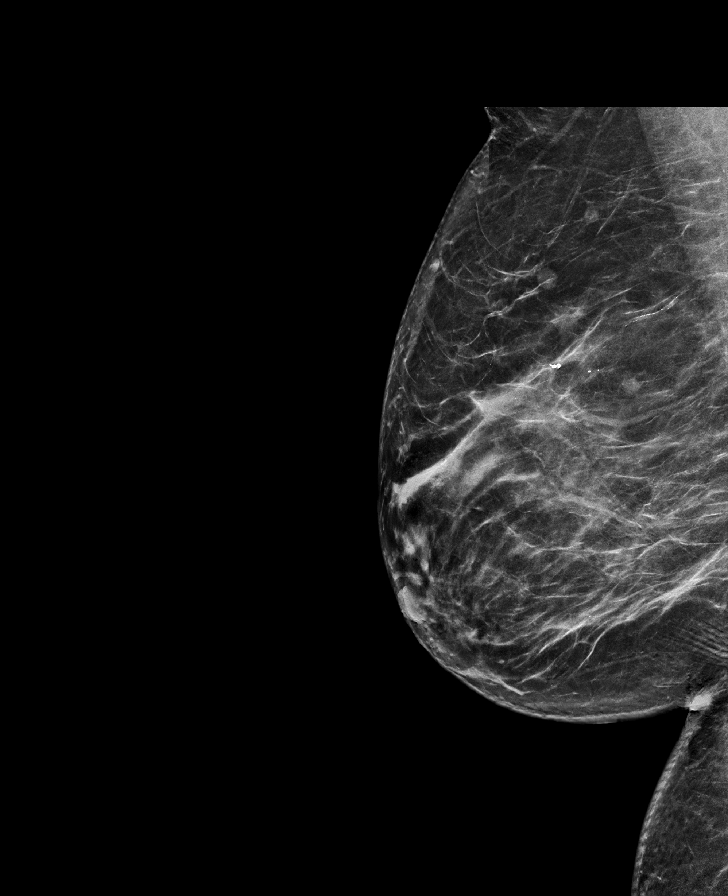

[L MLO synth-2D]
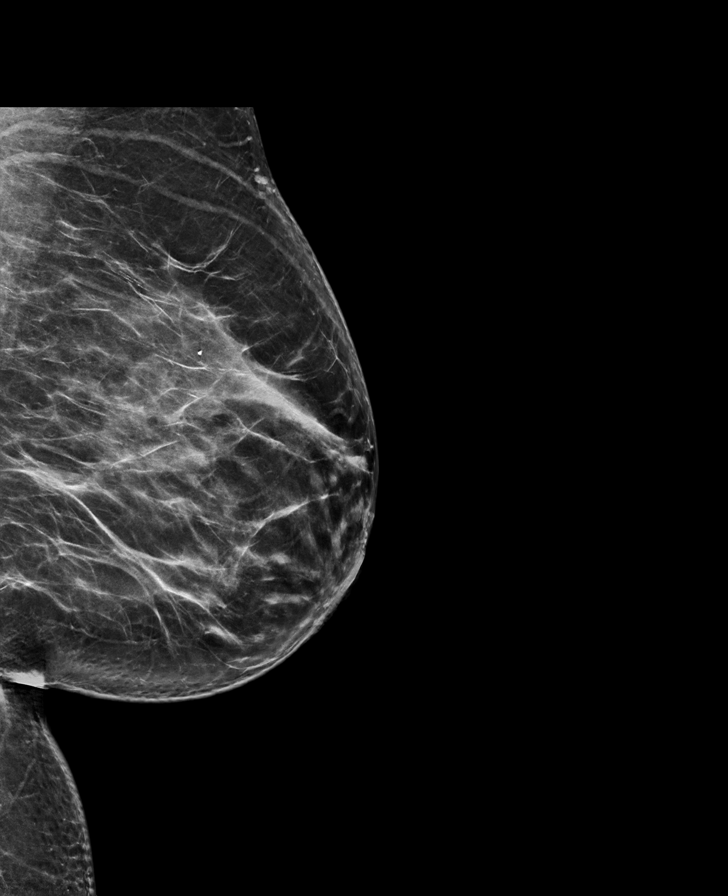

[R CC synth-2D]
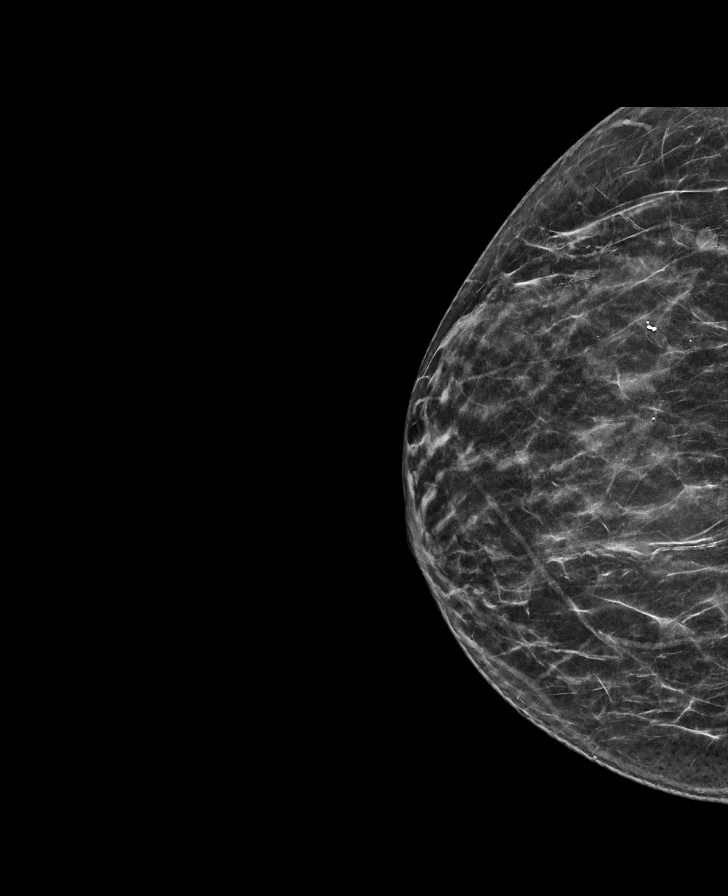

[L CC synth-2D]
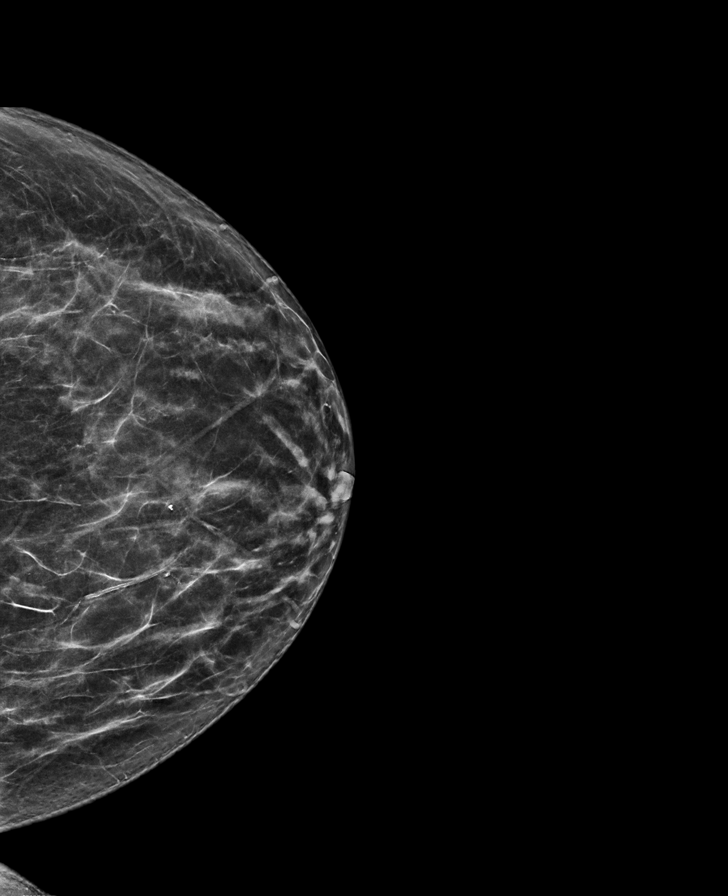

[R MLO tomo · tomo slice 41/82.0]
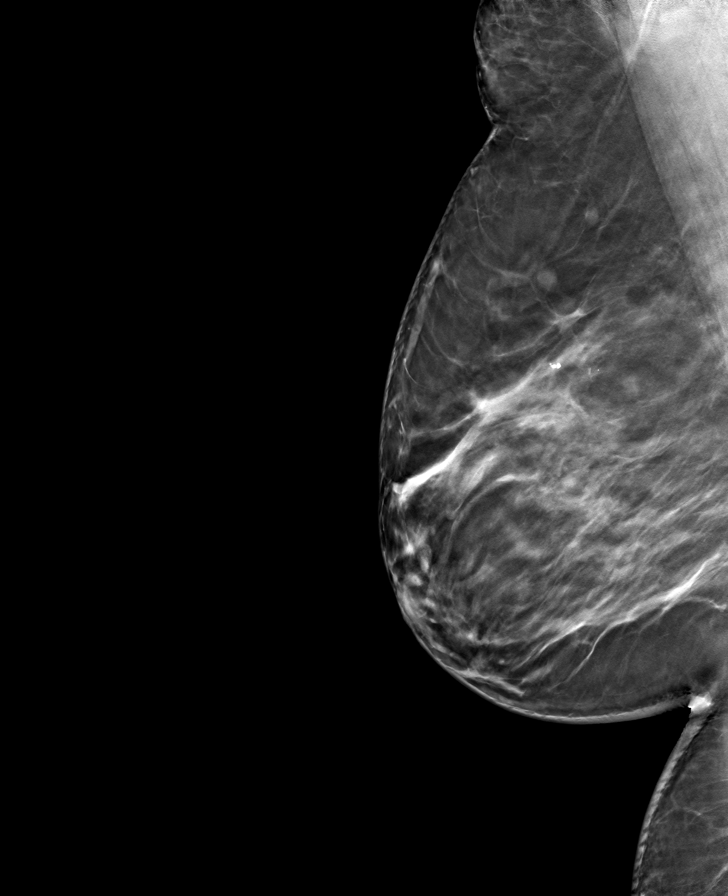

[R CC tomo · tomo slice 30/59.0]
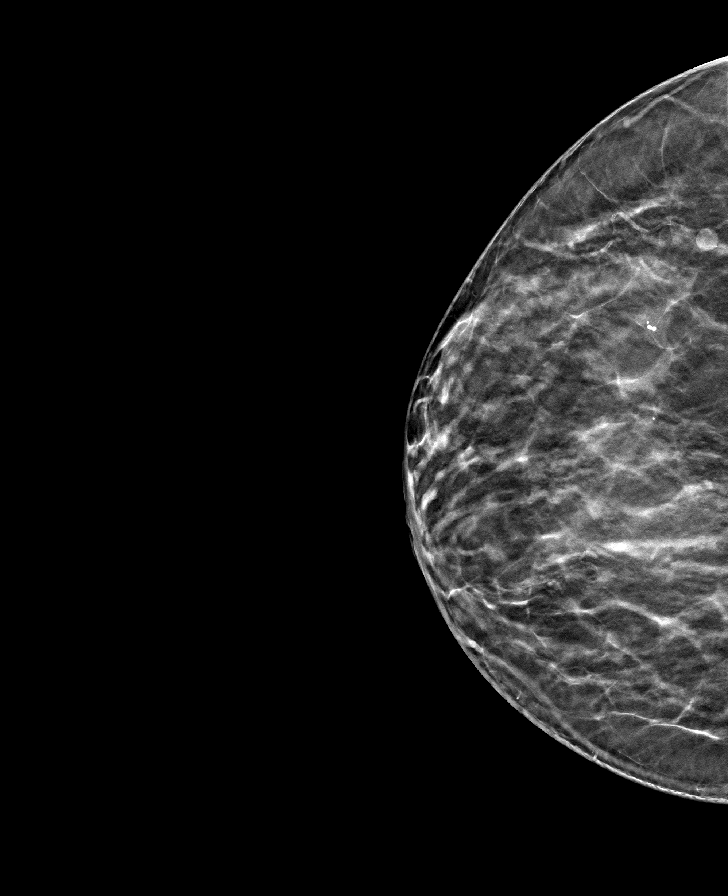

[L MLO tomo · tomo slice 41/82.0]
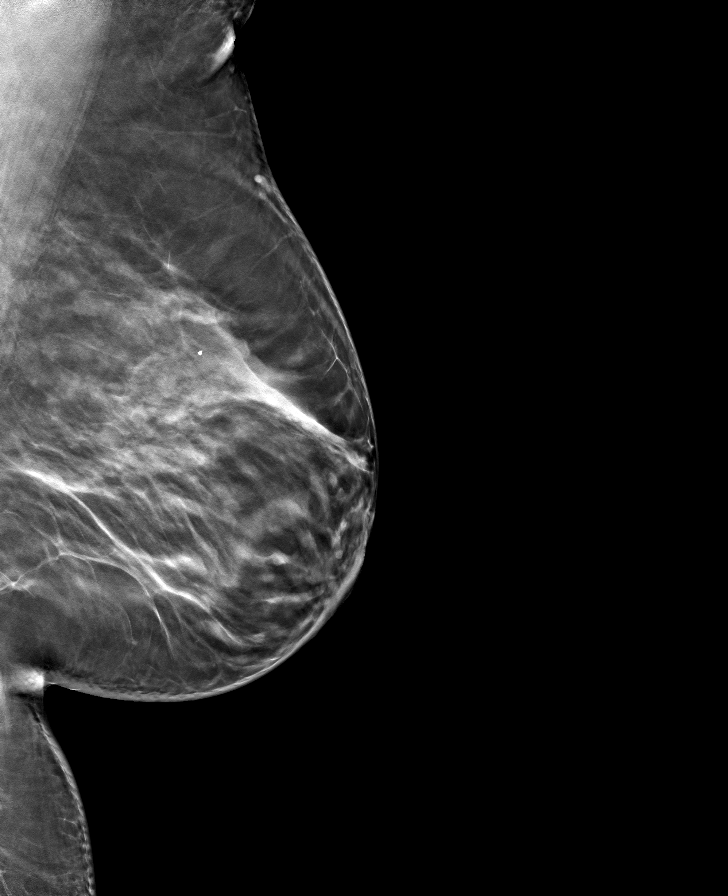

[L CC tomo · tomo slice 33/65.0]
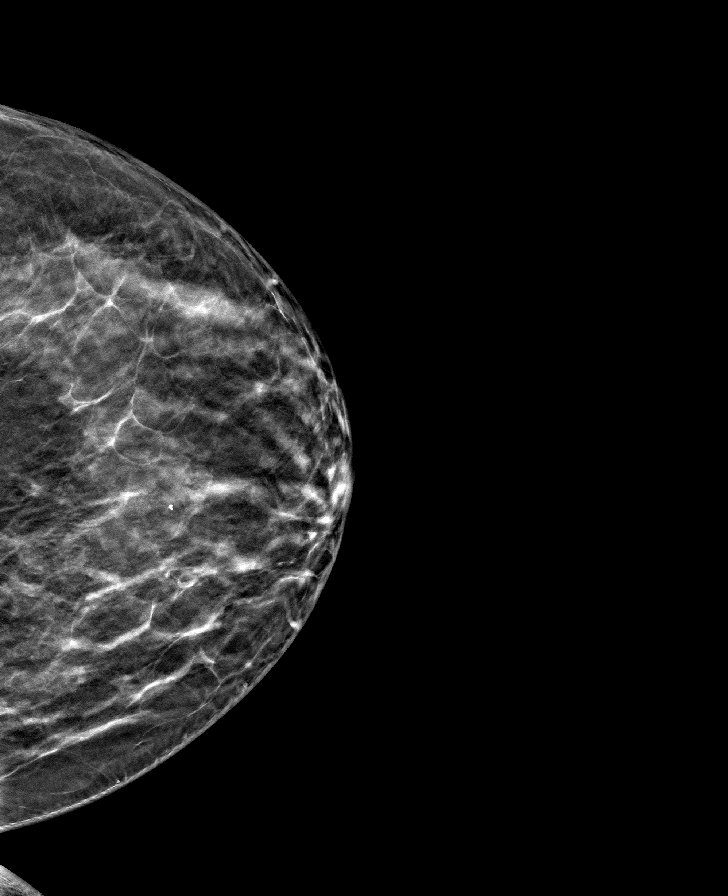

[8 of 24 positions shown; findings below may reference images not displayed]

ACR Breast Density Category c: The breast tissue is heterogeneously
dense, which may obscure small masses.
FINDINGS: In the right breast, a possible mass warrants further evaluation. In
the left breast, no findings suspicious for malignancy.
IMPRESSION: Further evaluation is suggested for a possible mass in the right
breast.

RECOMMENDATION:
Diagnostic mammogram and possibly ultrasound of the right breast.
(Code:82-G-AAO)

The patient will be contacted regarding the findings, and additional
imaging will be scheduled.

BI-RADS CATEGORY  0: Incomplete. Need additional imaging evaluation
and/or prior mammograms for comparison.

## 2023-03-06 ENCOUNTER — Ambulatory Visit
Admission: EM | Admit: 2023-03-06 | Discharge: 2023-03-06 | Disposition: A | Discharge status: Home / Self Care | Payer: BLUE CROSS/BLUE SHIELD | Attending: Physician Assistant | Admitting: Physician Assistant

## 2023-03-06 ENCOUNTER — Ambulatory Visit (INDEPENDENT_AMBULATORY_CARE_PROVIDER_SITE_OTHER): Payer: BLUE CROSS/BLUE SHIELD

## 2023-03-06 DIAGNOSIS — J45901 Unspecified asthma with (acute) exacerbation: Secondary | ICD-10-CM

## 2023-03-06 DIAGNOSIS — J189 Pneumonia, unspecified organism: Secondary | ICD-10-CM | POA: Diagnosis present

## 2023-03-06 DIAGNOSIS — R0602 Shortness of breath: Secondary | ICD-10-CM | POA: Diagnosis present

## 2023-03-06 DIAGNOSIS — R051 Acute cough: Secondary | ICD-10-CM | POA: Diagnosis present

## 2023-03-06 LAB — RESP PANEL BY RT-PCR (FLU A&B, COVID) ARPGX2
Influenza A by PCR: NEGATIVE
Influenza B by PCR: NEGATIVE
SARS Coronavirus 2 by RT PCR: NEGATIVE

## 2023-03-06 MED ORDER — ONDANSETRON 4 MG PO TBDP
4.0000 mg | ORAL_TABLET | Freq: Once | ORAL | Status: AC
  Administered 2023-03-06: 4 mg via ORAL

## 2023-03-06 MED ORDER — PREDNISONE 10 MG PO TABS: ORAL_TABLET | ORAL | 0 refills | Status: AC

## 2023-03-06 MED ORDER — ONDANSETRON 4 MG PO TBDP: 4.0000 mg | ORAL_TABLET | Freq: Four times a day (QID) | ORAL | 0 refills | Status: AC | PRN

## 2023-03-06 MED ORDER — AMOXICILLIN-POT CLAVULANATE 875-125 MG PO TABS: 1.0000 | ORAL_TABLET | Freq: Two times a day (BID) | ORAL | 0 refills | Status: AC

## 2023-03-06 MED ORDER — IPRATROPIUM-ALBUTEROL 0.5-2.5 (3) MG/3ML IN SOLN
3.0000 mL | Freq: Once | RESPIRATORY_TRACT | Status: AC
  Administered 2023-03-06: 3 mL via RESPIRATORY_TRACT

## 2023-03-06 MED ORDER — IPRATROPIUM-ALBUTEROL 0.5-2.5 (3) MG/3ML IN SOLN: 3.0000 mL | RESPIRATORY_TRACT | 0 refills | Status: AC | PRN

## 2023-03-06 MED ORDER — DEXAMETHASONE SODIUM PHOSPHATE 10 MG/ML IJ SOLN
10.0000 mg | Freq: Once | INTRAMUSCULAR | Status: AC
  Administered 2023-03-06: 10 mg via INTRAMUSCULAR

## 2023-03-06 MED ORDER — PROMETHAZINE-DM 6.25-15 MG/5ML PO SYRP: 5.0000 mL | ORAL_SOLUTION | Freq: Four times a day (QID) | ORAL | 0 refills | Status: AC | PRN

## 2023-03-06 MED ORDER — AZITHROMYCIN 250 MG PO TABS: 250.0000 mg | ORAL_TABLET | Freq: Every day | ORAL | 0 refills | Status: AC

## 2023-03-06 MED ORDER — ACETAMINOPHEN 325 MG PO TABS
650.0000 mg | ORAL_TABLET | Freq: Once | ORAL | Status: AC
  Administered 2023-03-06: 650 mg via ORAL

## 2023-03-06 NOTE — ED Provider Notes (Signed)
[facility]    CSN: [id number] Arrival date & time: 03/06/23  1115      History   Chief Complaint Chief Complaint  Patient presents with   Cough   Fever   Shortness of Breath   Nausea   Emesis    HPI Melanie Lester is a 52 y.o. female with history of asthma and hypertension.  She presents for 1 week history of fever, fatigue, cough, congestion, wheezing and shortness of breath.  She has also had a lot of posttussive vomiting and nausea.  Patient denies sore throat, sinus pain, chest pain.  Patient says she has been using her albuterol inhaler frequently and especially a lot more over the past couple of days.  No other complaints.  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      Home Medications    Prior to Admission medications   Medication Sig Start Date End Date Taking? Authorizing Provider  amoxicillin-clavulanate (AUGMENTIN) 875-125 MG tablet Take 1 tablet by mouth every 12 (twelve) hours for 7 days. 03/06/23 03/13/23 Yes Shirlee Latch, PA-C  azithromycin (ZITHROMAX) 250 MG tablet Take 1 tablet (250 mg total) by mouth daily. Take first 2 tablets together, then 1 every day until finished. 03/06/23  Yes Eusebio Friendly B, PA-C  chlorthalidone (HYGROTON) 25 MG tablet Take by mouth.   Yes [provider]  ipratropium-albuterol (DUONEB) 0.5-2.5 (3) MG/3ML SOLN Take 3 mLs by nebulization every 4 (four) hours as needed (wheezing/sob). 03/06/23  Yes Eusebio Friendly B, PA-C   ondansetron (ZOFRAN-ODT) 4 MG disintegrating tablet Take 1 tablet (4 mg total) by mouth every 6 (six) hours as needed for nausea or vomiting. 03/06/23  Yes Eusebio Friendly B, PA-C  predniSONE (DELTASONE) 10 MG tablet Take 5 tabs p.o. x 2 days, 4 tabs p.o. x 2 days, 3 tabs p.o. x 2 days, 2 tabs p.o. x 2 days, 1 tab p.o. x 2 days. 03/06/23  Yes Shirlee Latch, PA-C  promethazine-dextromethorphan (PROMETHAZINE-DM) 6.25-15 MG/5ML syrup Take 5 mLs by mouth 4 (four) times daily as needed. 03/06/23  Yes Eusebio Friendly B, PA-C  albuterol (VENTOLIN HFA) 108 (90 Base) MCG/ACT inhaler Inhale 2 puffs into the lungs every 6 (six) hours as needed for wheezing or shortness of breath. 07/09/19   Shaune Pollack, MD  B-D 3CC LUER-LOK SYR 22GX1" 22G X 1" 3 ML MISC SMARTSIG:injection As Directed 07/26/21   [provider]  benzonatate (TESSALON) 100 MG capsule Take 1 capsule (100 mg total) by mouth every 8 (eight) hours. 02/09/22   White, Elita Boone, NP  cyanocobalamin (,VITAMIN B-12,) 1000 MCG/ML injection Inject 1,000 mcg into the muscle every 30 (thirty) days. 07/26/21   [provider]  cyanocobalamin 100 MCG tablet Take by mouth.    [provider]  cyclobenzaprine (FLEXERIL) 5 MG tablet Take 1 tablet (5 mg total) by mouth at bedtime. 08/26/17  Menshew, Charlesetta Ivory, PA-C  diclofenac (VOLTAREN) 50 MG EC tablet diclofenac sodium 50 mg tablet,delayed release  1 BY MOUTH TWICE DAILY AS NEEDED FOR PAIN TAKE WITH FOOD    [provider]  ergocalciferol (VITAMIN D2) 1.25 MG (50000 UT) capsule ergocalciferol (vitamin D2) 1,250 mcg (50,000 unit) capsule  TAKE 1 CAPSULE BY MOUTH WEEKLY FOR 12 WEEKS    [provider]  furosemide (LASIX) 20 MG tablet Take 20 mg by mouth daily. 07/13/19   [provider]  ketorolac (TORADOL) 10 MG tablet Take 1 tablet (10 mg total) by mouth every 8 (eight) hours. 08/26/17   Menshew, Charlesetta Ivory, PA-C  Naproxen Sodium 220 MG CAPS Take by  mouth.    [provider]  oseltamivir (TAMIFLU) 75 MG capsule Take 1 capsule (75 mg total) by mouth every 12 (twelve) hours. 02/09/22   Valinda Hoar, NP    Family History Family History  Problem Relation Age of Onset   Hypertension Mother    Diabetes Mother    Hypertension Father    Diabetes Father    Heart attack Father    Tuberculosis Brother    Bone cancer Maternal Grandmother    Breast cancer Neg Hx     Social History Social History   Tobacco Use   Smoking status: Never   Smokeless tobacco: Never  Substance Use Topics   Alcohol use: No   Drug use: No     Allergies   Shellfish allergy, Clindamycin/lincomycin, Codeine, and Latex   Review of Systems Review of Systems  Constitutional:  Positive for fatigue and fever. Negative for chills and diaphoresis.  HENT:  Positive for congestion. Negative for ear pain, rhinorrhea, sinus pressure, sinus pain and sore throat.   Respiratory:  Positive for cough, chest tightness, shortness of breath and wheezing.   Cardiovascular:  Negative for chest pain.  Gastrointestinal:  Positive for nausea and vomiting. Negative for abdominal pain.  Musculoskeletal:  Negative for arthralgias and myalgias.  Skin:  Negative for rash.  Neurological:  Negative for weakness and headaches.  Hematological:  Negative for adenopathy.     Physical Exam Triage Vital Signs ED Triage Vitals [03/06/23 1455]  Encounter Vitals Group     BP 123/68     Systolic BP Percentile      Diastolic BP Percentile      Pulse Rate (!) 103     Resp (!) 26     Temp (!) 100.7 F (38.2 C)     Temp Source Oral     SpO2 (!) 87 %     Weight      Height      Head Circumference      Peak Flow      Pain Score 8     Pain Loc      Pain Education      Exclude from Growth Chart    No data found.  Updated Vital Signs BP 123/68 (BP Location: Left Arm)   Pulse (!) 109   Temp (!) 100.7 F (38.2 C) (Oral)   Resp (!) 24   SpO2 90% Comment: post neb tx  #3    Physical Exam Vitals and nursing note reviewed.  Constitutional:      General: She is not in acute distress.    Appearance: Normal appearance. She is ill-appearing. She is not toxic-appearing.  HENT:     Head: Normocephalic and atraumatic.     Nose: Congestion present.  Mouth/Throat:     Mouth: Mucous membranes are moist.     Pharynx: Oropharynx is clear.  Eyes:     General: No scleral icterus.       Right eye: No discharge.        Left eye: No discharge.     Conjunctiva/sclera: Conjunctivae normal.  Cardiovascular:     Rate and Rhythm: Regular rhythm. Tachycardia present.     Heart sounds: Normal heart sounds.  Pulmonary:     Effort: Respiratory distress present.     Breath sounds: Wheezing present.  Musculoskeletal:     Cervical back: Neck supple.  Skin:    General: Skin is dry.  Neurological:     General: No focal deficit present.     Mental Status: She is alert. Mental status is at baseline.     Motor: No weakness.     Gait: Gait normal.  Psychiatric:        Mood and Affect: Mood normal.      UC Treatments / Results  Labs (all labs ordered are listed, but only abnormal results are displayed) Labs Reviewed  RESP PANEL BY RT-PCR (FLU A&B, COVID) ARPGX2    EKG   Radiology No results found.  Procedures Procedures (including critical care time)  Medications Ordered in UC Medications  acetaminophen (TYLENOL) tablet 650 mg (650 mg Oral Given 03/06/23 1503)  ipratropium-albuterol (DUONEB) 0.5-2.5 (3) MG/3ML nebulizer solution 3 mL (3 mLs Nebulization Given 03/06/23 1502)  ondansetron (ZOFRAN-ODT) disintegrating tablet 4 mg (4 mg Oral Given 03/06/23 1501)  ipratropium-albuterol (DUONEB) 0.5-2.5 (3) MG/3ML nebulizer solution 3 mL (3 mLs Nebulization Given 03/06/23 1548)  ipratropium-albuterol (DUONEB) 0.5-2.5 (3) MG/3ML nebulizer solution 3 mL (3 mLs Nebulization Given 03/06/23 1624)  dexamethasone (DECADRON) injection 10 mg (10 mg Intramuscular  Given 03/06/23 1652)    Initial Impression / Assessment and Plan / UC Course  I have reviewed the triage vital signs and the nursing notes.  Pertinent labs & imaging results that were available during my care of the patient were reviewed by me and considered in my medical decision making (see chart for details).   52 year old female with history of asthma hypertension presents for 1 week history of fever, cough, congestion, wheezing and shortness of breath with worsening symptoms over the past 2 days.  Has been using albuterol inhaler frequently at home without relief.  Current temp is 100.7 degrees.  She is tachycardic at 103 bpm.  Oxygen saturation ranges from 87 to 91%.  On exam she has slight nasal congestion.  Is ill-appearing.  Nontoxic.  Increased respiratory rate.  Few scattered wheezes.  Patient given DuoNeb treatment by nursing staff.  Stopped about halfway through and had vomiting and could not continue with the treatment.  Oxygen steady at 91%.  Will obtain chest x-ray to evaluate for possible pneumonia and patient to be given Zofran.  Will repeat DuoNeb shortly.   Patient given 2 more DuoNebs to make 3.  Oxygen satting at 91%.  She does report breathing better.  Respiratory panel obtained by nursing staff is negative.  Chest x-ray shows right lower lobe pneumonia.  Discussed result with patient.  Explained that her oxygen is low and borderline concerning for needing to go to the emergency department.  She would like to avoid the ED.  Patient given 10 mg IM dexamethasone in clinic.  Prednisone taper sent to pharmacy for her to begin tomorrow.  Sent Augmentin and azithromycin for right lower lobe pneumonia.  Patient given nebulizer  and I sent DuoNeb solution to pharmacy.  Encouraged her to use this machine as directed for wheezing and shortness of breath.  Also sent Promethazine DM.  Encouraged increasing rest and fluids.  Reviewed going to ED if fever is not breaking in a couple of  days or if symptoms acutely worsen or do not improve in 2 days.  Explained that she should have a low threshold for going to ED.  I did send Zofran to pharmacy for her to take before taking her medications since she is complaining of a lot of nausea and is afraid of having an episode of emesis while taking meds.  Acute illness with systemic symptoms exacerbation of chronic underlying condition.   Final Clinical Impressions(s) / UC Diagnoses   Final diagnoses:  Shortness of breath  Pneumonia of right lower lobe due to infectious organism  Acute cough  Asthma with acute exacerbation, unspecified asthma severity, unspecified whether persistent     Discharge Instructions      -You have pneumonia of the right lower lung. - We gave you an injection of a corticosteroid in the clinic.  Start the prednisone taper tomorrow. - I sent you 2 different antibiotics to pharmacy.  Take them as prescribed. - Use the nebulizer machine every 4 hours as needed for wheezing and shortness of breath.  If needed you may perform 3 of these in a row every 15 minutes for significant exacerbation but if you feel that your breathing is not improving, you are not breaking the fever or you are not feeling any better in the next 2 days you may need to go to the emergency department.  Your oxygen is low but may be managed with these medications hopefully.  However, have a very low threshold to go to the ED if you are not feeling better or if you feel worse in the next couple days. - Repeat this x-ray through your PCP or return to our department in about 3 to 4 weeks to make sure the pneumonia has cleared.     ED Prescriptions     Medication Sig Dispense Auth. Provider   amoxicillin-clavulanate (AUGMENTIN) 875-125 MG tablet Take 1 tablet by mouth every 12 (twelve) hours for 7 days. 14 tablet Eusebio Friendly B, PA-C   azithromycin (ZITHROMAX) 250 MG tablet Take 1 tablet (250 mg total) by mouth daily. Take first 2 tablets  together, then 1 every day until finished. 6 tablet Eusebio Friendly B, PA-C   ipratropium-albuterol (DUONEB) 0.5-2.5 (3) MG/3ML SOLN Take 3 mLs by nebulization every 4 (four) hours as needed (wheezing/sob). 360 mL Eusebio Friendly B, PA-C   promethazine-dextromethorphan (PROMETHAZINE-DM) 6.25-15 MG/5ML syrup Take 5 mLs by mouth 4 (four) times daily as needed. 118 mL Eusebio Friendly B, PA-C   predniSONE (DELTASONE) 10 MG tablet Take 5 tabs p.o. x 2 days, 4 tabs p.o. x 2 days, 3 tabs p.o. x 2 days, 2 tabs p.o. x 2 days, 1 tab p.o. x 2 days. 30 tablet Eusebio Friendly B, PA-C   ondansetron (ZOFRAN-ODT) 4 MG disintegrating tablet Take 1 tablet (4 mg total) by mouth every 6 (six) hours as needed for nausea or vomiting. 20 tablet Gareth Morgan      PDMP not reviewed this encounter.   Shirlee Latch, PA-C 03/06/23 1659

## 2023-03-06 NOTE — Discharge Instructions (Signed)
-  You have pneumonia of the right lower lung. - We gave you an injection of a corticosteroid in the clinic.  Start the prednisone taper tomorrow. - I sent you 2 different antibiotics to pharmacy.  Take them as prescribed. - Use the nebulizer machine every 4 hours as needed for wheezing and shortness of breath.  If needed you may perform 3 of these in a row every 15 minutes for significant exacerbation but if you feel that your breathing is not improving, you are not breaking the fever or you are not feeling any better in the next 2 days you may need to go to the emergency department.  Your oxygen is low but may be managed with these medications hopefully.  However, have a very low threshold to go to the ED if you are not feeling better or if you feel worse in the next couple days. - Repeat this x-ray through your PCP or return to our department in about 3 to 4 weeks to make sure the pneumonia has cleared.

## 2023-03-06 NOTE — ED Triage Notes (Signed)
Productive cough yellow mucus sneezing fever body aches vomiting 1 week
